# Patient Record
Sex: Female | Born: 1950
Health system: Southern US, Community
[De-identification: ages and names within clinical notes are randomized; demographics above are authoritative.]

## PROBLEM LIST (undated history)

## (undated) DIAGNOSIS — H269 Unspecified cataract: Secondary | ICD-10-CM

## (undated) DIAGNOSIS — D649 Anemia, unspecified: Secondary | ICD-10-CM

## (undated) DIAGNOSIS — E785 Hyperlipidemia, unspecified: Secondary | ICD-10-CM

## (undated) DIAGNOSIS — T7840XA Allergy, unspecified, initial encounter: Secondary | ICD-10-CM

## (undated) DIAGNOSIS — I10 Essential (primary) hypertension: Secondary | ICD-10-CM

## (undated) DIAGNOSIS — R7303 Prediabetes: Secondary | ICD-10-CM

## (undated) HISTORY — DX: Anemia, unspecified: D64.9

## (undated) HISTORY — DX: Allergy, unspecified, initial encounter: T78.40XA

## (undated) HISTORY — DX: Hyperlipidemia, unspecified: E78.5

## (undated) HISTORY — PX: COLONOSCOPY: SHX174

## (undated) HISTORY — DX: Unspecified cataract: H26.9

## (undated) HISTORY — PX: HYSTEROTOMY: SHX1776

## (undated) HISTORY — PX: COLONOSCOPY: SHX5424

---

## 1999-03-29 ENCOUNTER — Emergency Department (HOSPITAL_COMMUNITY): Admission: EM | Admit: 1999-03-29 | Discharge: 1999-03-29 | Payer: Self-pay | Admitting: Emergency Medicine

## 1999-03-29 ENCOUNTER — Encounter: Payer: Self-pay | Admitting: Emergency Medicine

## 2002-04-30 ENCOUNTER — Emergency Department (HOSPITAL_COMMUNITY): Admission: EM | Admit: 2002-04-30 | Discharge: 2002-04-30 | Payer: Self-pay | Admitting: Emergency Medicine

## 2002-11-05 ENCOUNTER — Emergency Department (HOSPITAL_COMMUNITY): Admission: EM | Admit: 2002-11-05 | Discharge: 2002-11-05 | Payer: Self-pay | Admitting: Emergency Medicine

## 2003-01-15 ENCOUNTER — Encounter: Payer: Self-pay | Admitting: Internal Medicine

## 2003-01-15 ENCOUNTER — Encounter: Admission: RE | Admit: 2003-01-15 | Discharge: 2003-01-15 | Payer: Self-pay | Admitting: Internal Medicine

## 2003-02-04 ENCOUNTER — Emergency Department (HOSPITAL_COMMUNITY): Admission: EM | Admit: 2003-02-04 | Discharge: 2003-02-04 | Payer: Self-pay | Admitting: Emergency Medicine

## 2003-04-04 ENCOUNTER — Encounter: Payer: Self-pay | Admitting: Obstetrics and Gynecology

## 2003-04-10 ENCOUNTER — Encounter (INDEPENDENT_AMBULATORY_CARE_PROVIDER_SITE_OTHER): Payer: Self-pay

## 2003-04-10 ENCOUNTER — Inpatient Hospital Stay (HOSPITAL_COMMUNITY): Admission: RE | Admit: 2003-04-10 | Discharge: 2003-04-12 | Payer: Self-pay | Admitting: Obstetrics and Gynecology

## 2003-05-26 ENCOUNTER — Emergency Department (HOSPITAL_COMMUNITY): Admission: EM | Admit: 2003-05-26 | Discharge: 2003-05-26 | Payer: Self-pay | Admitting: Emergency Medicine

## 2003-06-02 ENCOUNTER — Encounter: Payer: Self-pay | Admitting: Emergency Medicine

## 2003-06-02 ENCOUNTER — Emergency Department (HOSPITAL_COMMUNITY): Admission: AD | Admit: 2003-06-02 | Discharge: 2003-06-02 | Payer: Self-pay | Admitting: Emergency Medicine

## 2003-11-21 ENCOUNTER — Encounter: Admission: RE | Admit: 2003-11-21 | Discharge: 2004-01-01 | Payer: Self-pay | Admitting: Neurosurgery

## 2004-10-02 ENCOUNTER — Ambulatory Visit: Payer: Self-pay | Admitting: Internal Medicine

## 2005-06-29 ENCOUNTER — Emergency Department (HOSPITAL_COMMUNITY): Admission: EM | Admit: 2005-06-29 | Discharge: 2005-06-29 | Payer: Self-pay | Admitting: Emergency Medicine

## 2005-07-01 ENCOUNTER — Emergency Department (HOSPITAL_COMMUNITY): Admission: EM | Admit: 2005-07-01 | Discharge: 2005-07-01 | Payer: Self-pay | Admitting: Emergency Medicine

## 2005-08-16 ENCOUNTER — Emergency Department (HOSPITAL_COMMUNITY): Admission: EM | Admit: 2005-08-16 | Discharge: 2005-08-16 | Payer: Self-pay | Admitting: Emergency Medicine

## 2006-02-10 ENCOUNTER — Emergency Department (HOSPITAL_COMMUNITY): Admission: EM | Admit: 2006-02-10 | Discharge: 2006-02-10 | Payer: Self-pay | Admitting: Emergency Medicine

## 2006-02-13 ENCOUNTER — Emergency Department (HOSPITAL_COMMUNITY): Admission: EM | Admit: 2006-02-13 | Discharge: 2006-02-13 | Payer: Self-pay | Admitting: Emergency Medicine

## 2006-11-10 ENCOUNTER — Encounter: Admission: RE | Admit: 2006-11-10 | Discharge: 2007-02-08 | Payer: Self-pay | Admitting: Psychiatry

## 2008-01-27 ENCOUNTER — Emergency Department (HOSPITAL_COMMUNITY): Admission: EM | Admit: 2008-01-27 | Discharge: 2008-01-27 | Payer: Self-pay | Admitting: Emergency Medicine

## 2008-06-04 ENCOUNTER — Emergency Department (HOSPITAL_COMMUNITY): Admission: EM | Admit: 2008-06-04 | Discharge: 2008-06-04 | Payer: Self-pay | Admitting: Emergency Medicine

## 2009-08-29 ENCOUNTER — Emergency Department (HOSPITAL_COMMUNITY): Admission: EM | Admit: 2009-08-29 | Discharge: 2009-08-29 | Payer: Self-pay | Admitting: Emergency Medicine

## 2010-03-02 ENCOUNTER — Emergency Department (HOSPITAL_BASED_OUTPATIENT_CLINIC_OR_DEPARTMENT_OTHER): Admission: EM | Admit: 2010-03-02 | Discharge: 2010-03-02 | Payer: Self-pay | Admitting: Emergency Medicine

## 2011-02-19 NOTE — H&P (Signed)
NAME:  Crystal Foster, Crystal Foster                           ACCOUNT NO.:  [id number]   MEDICAL RECORD NO.:  [id number]                   PATIENT TYPE:  INP   LOCATION:  NA                                   FACILITY:  [facility]   PHYSICIAN:  Naima A. Dillard, M.D.              DATE OF BIRTH:  1951/04/27   DATE OF ADMISSION:  04/09/2003  DATE OF DISCHARGE:                                HISTORY & PHYSICAL   CHIEF COMPLAINT:  Abdominal pain, large uterine fibroids.   HISTORY OF PRESENT ILLNESS:  The patient is a 61 year old African American  female, gravida 1, para 1 with last menstrual period March 14, 2003, who  presented to me back in May 2004, complaining of abdominal pain for the last  four months that worsened with her period. The pain is in her abdomen and  radiates to the lower front. She has tried heating pads and Flexeril without  any help. The patient states that she did not realize how large the mass was  in her abdomen, which is 22 week size. She does not know if the fibroids  have been there or just recently came up. She denies having any change in  bowel or bladder habits. No major weight loss and no change in appetite. She  denies having any vaginal discharge, dyspareunia or urinary symptoms. She  denies having any history of kidney stones or GI symptoms or endometriosis.  The patient states that this was her first ultrasound which demonstrated  large fibroids.   PAST GYNECOLOGICAL HISTORY:  Significant for menarche at age 39 occurring  every month, lasting for five days. She uses Tampons q.2-3 h. She denies  having any intramenstrual finding or irregular bleeding. She does have a  history of Trichomonas which was treated in the past.   PAST SURGICAL HISTORY:  Significant for tubal ligation.   PAST MEDICAL HISTORY:  Significant for high blood pressure.   FAMILY HISTORY:  Significant for mother with breast cancer. Her last  mammogram was within normal limits.   SOCIAL HISTORY:  The  patient denies any alcohol, tobacco or illicit drug  use. The patient lives with her partner who is very supportive.   CURRENT MEDICATIONS:  The patient states that she is not on any medications  for her high blood pressure at this time.   REVIEW OF SYSTEMS:  HEENT:  The patient does wear reading glasses. CARDIAC:  She denies having any cardiovascular or respiratory illnesses.  GENITOURINARY:  As above. MUSCULOSKELETAL:  Normal. PSYCHIATRIC:  Normal.   PHYSICAL EXAMINATION:  VITAL SIGNS:  Blood pressure 140/100, weight 183  pounds.  HEENT:  Pupils are equal. Hearing is normal. Throat is clear.  NECK:  Her thyroid is not enlarged.  HEART:  Regular rate and rhythm.  LUNGS:  Clear to auscultation bilaterally.  BREASTS:  No masses, discharge, skin changes or nipple retraction.  BACK:  No  CVA tenderness.  ABDOMEN:  Is about 22 week size uterus, mobile.  EXTREMITIES:  No clubbing, cyanosis, or edema.  NEUROVASCULAR:  Examination is within normal limits.  PELVIC:  Her vulva and vaginal examinations are within normal limits. Her  cervix is nontender without lesions. Her uterus is normal shape, size, and  consistency. Her adnexa have no masses. Rectovaginally, there are no masses  palpated. Endometrial biopsy was attempted to be obtained and there were  just fragments of benign squamous mucosa. Pap smear in March 2004, was noted  to be normal.   LABORATORY DATA:  Her hemoglobin in the office was 10.0.   The patient's ultrasound and CT scan were significant for enlarged uterus  suggestive of fibroids, 22 week size. Her ovaries were not seen on  ultrasound. On CT scan, there was no intraabdominal adenopathy seen.   ASSESSMENT:  Symptomatic fibroid.  It is difficult to rule out sarcoma in  this patient because she stated that she never realized her fibroids were  this large. Dr. Stanford Breed will be on standby. The patient will undergo  a total abdominal hysterectomy, bilateral  salpingo-oophorectomy. All  treatments of fibroids were explained to the patient, medical treatment,  observation, uterine artery embolization, and hysterectomy. The patient  understands that she will not be able to have any children. She understands  that the risks are, but not limited to, bleeding, infection, damage to  internal organs such as bowel and bladder. She also understands that this  could be a cancer and she may need a more extensive surgery. Bowel  preparation was given to the patient.                                               Naima A. Normand Sloop, M.D.    NAD/MEDQ  D:  04/09/2003  T:  04/09/2003  Job:  [id number]   cc:   De Blanch, M.D.

## 2011-02-19 NOTE — Discharge Summary (Signed)
   NAME:  Crystal Foster, Crystal Foster                           ACCOUNT NO.:  [id number]   MEDICAL RECORD NO.:  [id number]                   PATIENT TYPE:  INP   LOCATION:  0465                                 FACILITY:  [facility]   PHYSICIAN:  Naima A. Dillard, M.D.              DATE OF BIRTH:  [birth year]   DATE OF ADMISSION:  04/10/2003  DATE OF DISCHARGE:  04/12/2003                                 DISCHARGE SUMMARY   ADMISSION DIAGNOSIS:  Symptomatic fibroids.   DISCHARGE DIAGNOSIS:  Postoperative day #2 status post total abdominal  hysterectomy/bilateral salpingo-oophorectomy.   DISCHARGE MEDICATIONS:  Percocet, Motrin, Phenergan, Colace, and Dilaudid.   INSTRUCTIONS:  The patient was to remain on pelvic rest, no driving for two  weeks, no heavy lifting for four weeks, and no intercourse for six weeks.  The patient was also given instructions per [facility] and was to report to the  office on April 20, 2003 for staple removal.   HOSPITAL COURSE:  The patient underwent at total abdominal hysterectomy and  bilateral salpingo-oophorectomy on April 10, 2003.  She remained afebrile with  stable vital signs.  The patient was noted to have anemia down to 9.6 and  hypokalemia with a potassium of 3.3.  The rest of her labs were within  normal limits.  Her exam remained benign.  She did have bowel recovery and  normal genitourinary function upon discharge.  The patient was discharged  home.  She will follow up in a couple of days for staple removal and for a  postoperative visit in six weeks.                                               Naima A. Normand Sloop, M.D.    NAD/MEDQ  D:  05/20/2003  T:  05/20/2003  Job:  [id number]

## 2011-02-19 NOTE — Op Note (Signed)
NAME:  Crystal Foster, Crystal Foster                           ACCOUNT NO.:  [id number]   MEDICAL RECORD NO.:  [id number]                   PATIENT TYPE:  INP   LOCATION:  0006                                 FACILITY:  [facility]   PHYSICIAN:  Naima A. Dillard, M.D.              DATE OF BIRTH:  03-31-1951   DATE OF PROCEDURE:  04/10/2003  DATE OF DISCHARGE:                                 OPERATIVE REPORT   PREOPERATIVE DIAGNOSIS:  Symptomatic fibroids, rule out sarcoma.   POSTOPERATIVE DIAGNOSIS:  Symptomatic fibroids.   PROCEDURE:  Total abdominal hysterectomy and bilateral salpingo-  oophorectomy.   SURGEON:  Naima A. Normand Sloop, M.D.   ASSISTANT:  Dois Davenport A. Rivard, M.D.   ANESTHESIA:  General endotracheal tube.   COMPLICATIONS:  None.   FINDINGS:  A 22-week size multi fibroid uterus, normal appearing tubes and  ovaries bilaterally, normal appearing abdominal anatomy, normal appendix.   ESTIMATED BLOOD LOSS:  300 mL.   URINE OUTPUT:  250 mL clear urine.   FLUIDS REPLACED:  3000 mL IV fluid.   DISPOSITION:  The patient went to the recovery room in stable condition.   INDICATIONS:  The patient is a 60 year old African American female, gravida  1, para 1, who is presenting for a TAH/BSO and rule out sarcoma.  The  patient believes that the fibroids may have grown over the last two months.  The patient understands the risk of the surgery not limited to bleeding,  infection, damage to internal organs such as bowel and bladder and  understands that she may need staging procedure by Dr. Stanford Breed.   DESCRIPTION OF PROCEDURE:  The patient was taken to the operating room,  prepped and draped in the normal sterile fashion.  A Foley catheter was  inserted.  A vertical incision was then made 2 cm above the symphysis pubis  to about 1 cm below the umbilicus.  It was carried down to the fascia using  Bovie cautery.  The fascia was incised in the midline and extended  inferiorly and superiorly  with Bovie cautery.  Kochers x2 were placed on the  fascia which was separated off the midline, both bluntly and sharply with  Bovie cautery.  The peritoneum was identified, tented up and entered sharply  and extended superiorly and inferiorly with good visualization of bowel and  bladder.  Peritoneal washings were obtained.  The uterus was felt to be  smooth.  The uterus was then removed from the abdominal cavity.  The bowel  was packed with laparotomy sponges.  The Balfour retractor was then placed  without difficulty.  The patient's round ligaments were suture ligated,  transected; hemostasis was assured.  The bladder flap was then created using  Metzenbaum scissors.  The vesicouterine peritoneum was cut and transected  along the cervix and bluntly and sharply dissected away from the cervix.   Attention was then turned to the patient's  left infundibulopelvic ligament.  The ureter was first identified.  The posterior aspect of the peritoneum was  transected and removed with blunt dissection with suction.  The ureter was  identified and noted to be peristaltic.  Verifiable from the ureter, the  infundibulopelvic ligament was doubly clamped and transected on the left,  first made hemostatic and tied off with a free tie and than stitched.  Hemostasis was noted.  The right infundibulopelvic ligament was doubly  clamped, transected and free tied and suture ligated in similar fashion.  The infundibulopelvic ligament was clamped.  Her right ureter was identified  and noted to be peristaltic.  The bladder was further dissected off the  cervix. The uterine arteries were skeletonized bilaterally and clamped with  Heaney clamps, transected, suture ligated.  Hemostasis was noted.  The  cardinal ligaments were doubly clamped, transected and suture ligated.  The  patient did have some backbleeding and when we got down to the cardinal  ligaments and decided to put a corkscrew into the top of the uterus  as a  means to help manipulate the uterus, it was so big.  The cardinal ligament  was doubly clamped, transected and suture ligated.  Hemostasis was assured.  The uterosacral ligament were doubly clamped, transected and suture ligated.  At this time, there was some bleeding from cervical branch of the uterine  artery and this was made hemostatic with a stitch and Bovie cautery on the  cervical stump.  After the cardinal ligaments were transected, the uterus  was removed and part of the cervix was sent down for frozen section.  Dr.  Guilford Shi called and said that each fibroid looked totally benign. The  endometrium also appeared benign and both ovaries and tubes were benign.  The cervix was then doubly clamped and transected off using the Jorgenson  scissors. Angles were suture ligated with 0 Vicryl and the cuff was suture  ligated in a figure-of-eight fashion with 0 Vicryls.  The abdomen and pelvis  was irrigated with copious amounts of saline.  The right cardinal ligament  was seen to have some oozing.  The right ureter was identified and then the  cardinal ligament was grasped and a free tie was placed around it.  Hemostasis was assured.  Saline irrigation with warm saline and hemostasis  was noted on all pedicles and along the cuff.  The patient's appendix was  noted to be normal at this time.  All instruments were removed from the  abdomen.  The fascia was closed with 0 Vicryl in a running fashion to the  midline from the top to the bottom and then to the midline from the bottom  to the top.  Hemostasis was assured.  The pelvic washings were discarded.  The fibroids were noted to be normal.  Dr. Guilford Shi noted it was greater than  1400 g.  The subcutaneous tissue was irrigated with saline.  Any bleeding  vessels were made hemostatic with Bovie cautery.  The skin was closed with  staples.  Sponge, lap, and needle counts were correct x2.  The patient went to the recovery room in stable  condition.   The omentum was smooth.  There was no studding and no signs of cancer.  Naima A. Normand Sloop, M.D.    NAD/MEDQ  D:  04/10/2003  T:  04/10/2003  Job:  [id number]

## 2012-01-07 ENCOUNTER — Encounter (HOSPITAL_COMMUNITY): Payer: Self-pay | Admitting: Emergency Medicine

## 2012-01-07 ENCOUNTER — Emergency Department (HOSPITAL_COMMUNITY)
Admission: EM | Admit: 2012-01-07 | Discharge: 2012-01-08 | Disposition: A | Discharge status: Home / Self Care | Payer: No Typology Code available for payment source | Attending: Emergency Medicine | Admitting: Emergency Medicine

## 2012-01-07 DIAGNOSIS — I10 Essential (primary) hypertension: Secondary | ICD-10-CM | POA: Insufficient documentation

## 2012-01-07 DIAGNOSIS — S139XXA Sprain of joints and ligaments of unspecified parts of neck, initial encounter: Secondary | ICD-10-CM | POA: Insufficient documentation

## 2012-01-07 DIAGNOSIS — M25569 Pain in unspecified knee: Secondary | ICD-10-CM | POA: Insufficient documentation

## 2012-01-07 DIAGNOSIS — S161XXA Strain of muscle, fascia and tendon at neck level, initial encounter: Secondary | ICD-10-CM

## 2012-01-07 DIAGNOSIS — R079 Chest pain, unspecified: Secondary | ICD-10-CM | POA: Insufficient documentation

## 2012-01-07 HISTORY — DX: Essential (primary) hypertension: I10
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
## 2012-01-08 ENCOUNTER — Emergency Department (HOSPITAL_COMMUNITY): Payer: No Typology Code available for payment source

## 2012-01-08 MED ORDER — DIAZEPAM 5 MG PO TABS: 5.0000 mg | ORAL_TABLET | Freq: Two times a day (BID) | ORAL | Status: AC

## 2012-01-08 MED ORDER — IBUPROFEN 600 MG PO TABS: 600.0000 mg | ORAL_TABLET | Freq: Four times a day (QID) | ORAL | Status: AC | PRN
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
## 2012-01-08 NOTE — ED Notes (Signed)
Patient transported to X-ray 

## 2012-01-12 NOTE — ED Provider Notes (Signed)
Medical screening examination/treatment/procedure(s) were performed by non-physician practitioner and as supervising physician I was immediately available for consultation/collaboration.  Nicolet Griffy K Linker, MD 01/12/12 0655 

## 2012-08-15 ENCOUNTER — Other Ambulatory Visit: Payer: Self-pay | Admitting: Surgery

## 2013-01-10 ENCOUNTER — Other Ambulatory Visit: Payer: Self-pay

## 2013-01-10 DIAGNOSIS — Z1231 Encounter for screening mammogram for malignant neoplasm of breast: Secondary | ICD-10-CM

## 2013-02-20 ENCOUNTER — Ambulatory Visit
Admission: RE | Admit: 2013-02-20 | Discharge: 2013-02-20 | Disposition: A | Discharge status: Home / Self Care | Payer: Medicaid Other | Source: Ambulatory Visit

## 2013-02-20 DIAGNOSIS — Z1231 Encounter for screening mammogram for malignant neoplasm of breast: Secondary | ICD-10-CM

## 2013-02-21 ENCOUNTER — Other Ambulatory Visit: Payer: Self-pay | Admitting: Family

## 2013-02-21 DIAGNOSIS — R928 Other abnormal and inconclusive findings on diagnostic imaging of breast: Secondary | ICD-10-CM

## 2013-03-06 ENCOUNTER — Ambulatory Visit
Admission: RE | Admit: 2013-03-06 | Discharge: 2013-03-06 | Disposition: A | Discharge status: Home / Self Care | Payer: Medicaid Other | Source: Ambulatory Visit | Attending: Family | Admitting: Family

## 2013-03-06 ENCOUNTER — Other Ambulatory Visit: Payer: Self-pay | Admitting: Family

## 2013-03-06 DIAGNOSIS — R928 Other abnormal and inconclusive findings on diagnostic imaging of breast: Secondary | ICD-10-CM

## 2013-03-15 ENCOUNTER — Inpatient Hospital Stay: Admission: RE | Admit: 2013-03-15 | Payer: Medicaid Other | Source: Ambulatory Visit

## 2013-03-21 ENCOUNTER — Ambulatory Visit
Admission: RE | Admit: 2013-03-21 | Discharge: 2013-03-21 | Disposition: A | Discharge status: Home / Self Care | Payer: Medicare Other | Source: Ambulatory Visit | Attending: Family | Admitting: Family

## 2013-03-21 ENCOUNTER — Other Ambulatory Visit: Payer: Self-pay | Admitting: Family

## 2013-03-21 ENCOUNTER — Other Ambulatory Visit (HOSPITAL_COMMUNITY): Payer: Self-pay | Admitting: Radiology

## 2013-03-21 DIAGNOSIS — R928 Other abnormal and inconclusive findings on diagnostic imaging of breast: Secondary | ICD-10-CM

## 2013-03-22 ENCOUNTER — Inpatient Hospital Stay: Admission: RE | Admit: 2013-03-22 | Payer: Medicare Other | Source: Ambulatory Visit

## 2013-04-19 ENCOUNTER — Emergency Department (HOSPITAL_COMMUNITY): Payer: Medicare Other

## 2013-04-19 ENCOUNTER — Emergency Department (HOSPITAL_COMMUNITY)
Admission: EM | Admit: 2013-04-19 | Discharge: 2013-04-19 | Disposition: A | Discharge status: Home / Self Care | Payer: Medicare Other | Attending: Emergency Medicine | Admitting: Emergency Medicine

## 2013-04-19 ENCOUNTER — Encounter (HOSPITAL_COMMUNITY): Payer: Self-pay | Admitting: Emergency Medicine

## 2013-04-19 DIAGNOSIS — Z862 Personal history of diseases of the blood and blood-forming organs and certain disorders involving the immune mechanism: Secondary | ICD-10-CM | POA: Insufficient documentation

## 2013-04-19 DIAGNOSIS — I10 Essential (primary) hypertension: Secondary | ICD-10-CM | POA: Insufficient documentation

## 2013-04-19 DIAGNOSIS — G8911 Acute pain due to trauma: Secondary | ICD-10-CM | POA: Insufficient documentation

## 2013-04-19 DIAGNOSIS — R109 Unspecified abdominal pain: Secondary | ICD-10-CM

## 2013-04-19 DIAGNOSIS — Z79899 Other long term (current) drug therapy: Secondary | ICD-10-CM | POA: Insufficient documentation

## 2013-04-19 DIAGNOSIS — Z8679 Personal history of other diseases of the circulatory system: Secondary | ICD-10-CM | POA: Insufficient documentation

## 2013-04-19 DIAGNOSIS — Z8639 Personal history of other endocrine, nutritional and metabolic disease: Secondary | ICD-10-CM | POA: Insufficient documentation

## 2013-04-19 DIAGNOSIS — Z8719 Personal history of other diseases of the digestive system: Secondary | ICD-10-CM | POA: Insufficient documentation

## 2013-04-19 HISTORY — DX: Prediabetes: R73.03

## 2013-04-19 LAB — URINALYSIS, ROUTINE W REFLEX MICROSCOPIC
Ketones, ur: NEGATIVE mg/dL
Leukocytes, UA: NEGATIVE
Protein, ur: NEGATIVE mg/dL
Urobilinogen, UA: 1 mg/dL (ref 0.0–1.0)

## 2013-04-19 MED ORDER — TRAMADOL HCL 50 MG PO TABS: 50.0000 mg | ORAL_TABLET | Freq: Four times a day (QID) | ORAL | Status: DC | PRN

## 2013-04-19 MED ORDER — IBUPROFEN 600 MG PO TABS: 600.0000 mg | ORAL_TABLET | Freq: Three times a day (TID) | ORAL | Status: AC

## 2013-04-19 MED ORDER — KETOROLAC TROMETHAMINE 30 MG/ML IJ SOLN
30.0000 mg | Freq: Once | INTRAMUSCULAR | Status: AC
  Administered 2013-04-19: 30 mg via INTRAMUSCULAR
  Filled 2013-04-19: qty 1

## 2013-04-19 NOTE — ED Notes (Signed)
Patient transported to X-ray 

## 2013-04-19 NOTE — ED Notes (Signed)
Patient returned from x ray. MD at bedside

## 2013-04-19 NOTE — ED Notes (Addendum)
Patient with history of falls without head injury or LOC beginning in February reports to ED for ongoing pain the past four months to her right side above the hip and below the rib cage, radiating to the back, for which she has been seen on multiple occasions without achieving adequate pain management. Per patient previous encounters have resulted in tentative diagnosis of fatty liver disease and providers have suspected that pain is muscular and related to falls. Patient reports constipation, with last bowel movement to be April 13, 2013. Patient denies numbness/tingling in feet or legs. Patient ambulates without assistance, ataxia, or difficulty. Patient attributes falls to weakness in her legs due to increases in the pain, denies dizziness, vision changes, or muscle spasms. Patient denies taking pain medications.
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
## 2014-05-29 ENCOUNTER — Other Ambulatory Visit: Payer: Self-pay

## 2014-05-29 DIAGNOSIS — Z1231 Encounter for screening mammogram for malignant neoplasm of breast: Secondary | ICD-10-CM

## 2014-06-07 ENCOUNTER — Ambulatory Visit
Admission: RE | Admit: 2014-06-07 | Discharge: 2014-06-07 | Disposition: A | Discharge status: Home / Self Care | Payer: PRIVATE HEALTH INSURANCE | Source: Ambulatory Visit

## 2014-06-07 ENCOUNTER — Encounter (INDEPENDENT_AMBULATORY_CARE_PROVIDER_SITE_OTHER): Payer: Self-pay

## 2014-06-07 DIAGNOSIS — Z1231 Encounter for screening mammogram for malignant neoplasm of breast: Secondary | ICD-10-CM

## 2014-10-04 HISTORY — PX: COLONOSCOPY: SHX174

## 2015-03-19 ENCOUNTER — Encounter (HOSPITAL_COMMUNITY): Payer: Self-pay | Admitting: Emergency Medicine

## 2015-03-19 ENCOUNTER — Emergency Department (HOSPITAL_COMMUNITY)
Admission: EM | Admit: 2015-03-19 | Discharge: 2015-03-20 | Disposition: A | Discharge status: Home / Self Care | Payer: Medicare Other | Attending: Emergency Medicine | Admitting: Emergency Medicine

## 2015-03-19 ENCOUNTER — Emergency Department (HOSPITAL_COMMUNITY): Payer: Medicare Other

## 2015-03-19 DIAGNOSIS — S0003XA Contusion of scalp, initial encounter: Secondary | ICD-10-CM | POA: Diagnosis not present

## 2015-03-19 DIAGNOSIS — G43909 Migraine, unspecified, not intractable, without status migrainosus: Secondary | ICD-10-CM | POA: Diagnosis not present

## 2015-03-19 DIAGNOSIS — Y998 Other external cause status: Secondary | ICD-10-CM | POA: Insufficient documentation

## 2015-03-19 DIAGNOSIS — I10 Essential (primary) hypertension: Secondary | ICD-10-CM | POA: Diagnosis not present

## 2015-03-19 DIAGNOSIS — Y9389 Activity, other specified: Secondary | ICD-10-CM | POA: Insufficient documentation

## 2015-03-19 DIAGNOSIS — S0083XA Contusion of other part of head, initial encounter: Secondary | ICD-10-CM | POA: Diagnosis not present

## 2015-03-19 DIAGNOSIS — S0990XA Unspecified injury of head, initial encounter: Secondary | ICD-10-CM | POA: Diagnosis present

## 2015-03-19 DIAGNOSIS — Y9289 Other specified places as the place of occurrence of the external cause: Secondary | ICD-10-CM | POA: Insufficient documentation

## 2015-03-19 DIAGNOSIS — Z7951 Long term (current) use of inhaled steroids: Secondary | ICD-10-CM | POA: Insufficient documentation

## 2015-03-19 DIAGNOSIS — Z79899 Other long term (current) drug therapy: Secondary | ICD-10-CM | POA: Diagnosis not present

## 2015-03-19 DIAGNOSIS — G44309 Post-traumatic headache, unspecified, not intractable: Secondary | ICD-10-CM

## 2015-03-19 MED ORDER — IBUPROFEN 800 MG PO TABS
800.0000 mg | ORAL_TABLET | Freq: Once | ORAL | Status: AC
  Administered 2015-03-19: 800 mg via ORAL
  Filled 2015-03-19: qty 1

## 2015-03-19 NOTE — ED Notes (Signed)
Patient transported to CT 
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
## 2015-03-20 DIAGNOSIS — S0003XA Contusion of scalp, initial encounter: Secondary | ICD-10-CM | POA: Diagnosis not present

## 2015-03-20 NOTE — ED Notes (Signed)
Pt. Given 240cc of water and saltines and graham crackers

## 2015-03-20 NOTE — ED Notes (Signed)
Pt. Left with all belongings and refused wheelchair 
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
## 2015-06-23 ENCOUNTER — Other Ambulatory Visit (HOSPITAL_COMMUNITY): Payer: Self-pay | Admitting: Family

## 2015-06-23 DIAGNOSIS — E215 Disorder of parathyroid gland, unspecified: Secondary | ICD-10-CM

## 2015-07-08 ENCOUNTER — Encounter (HOSPITAL_COMMUNITY): Payer: Medicare Other

## 2015-07-16 ENCOUNTER — Ambulatory Visit (HOSPITAL_COMMUNITY): Payer: Medicare Other

## 2015-07-16 ENCOUNTER — Encounter (HOSPITAL_COMMUNITY): Payer: Medicare Other

## 2015-07-22 ENCOUNTER — Other Ambulatory Visit: Payer: Self-pay

## 2015-07-22 DIAGNOSIS — Z1231 Encounter for screening mammogram for malignant neoplasm of breast: Secondary | ICD-10-CM

## 2015-07-31 ENCOUNTER — Encounter (HOSPITAL_COMMUNITY): Payer: Medicare Other

## 2015-08-21 ENCOUNTER — Ambulatory Visit
Admission: RE | Admit: 2015-08-21 | Discharge: 2015-08-21 | Disposition: A | Discharge status: Home / Self Care | Payer: Medicare Other | Source: Ambulatory Visit

## 2015-08-21 DIAGNOSIS — Z1231 Encounter for screening mammogram for malignant neoplasm of breast: Secondary | ICD-10-CM

## 2015-08-22 ENCOUNTER — Other Ambulatory Visit: Payer: Self-pay | Admitting: Family

## 2015-08-22 DIAGNOSIS — R928 Other abnormal and inconclusive findings on diagnostic imaging of breast: Secondary | ICD-10-CM

## 2016-08-12 DIAGNOSIS — R0789 Other chest pain: Secondary | ICD-10-CM | POA: Diagnosis not present

## 2016-08-12 DIAGNOSIS — M25512 Pain in left shoulder: Secondary | ICD-10-CM | POA: Diagnosis not present

## 2016-08-12 DIAGNOSIS — R0602 Shortness of breath: Secondary | ICD-10-CM | POA: Diagnosis not present

## 2016-08-12 DIAGNOSIS — Z79899 Other long term (current) drug therapy: Secondary | ICD-10-CM | POA: Diagnosis not present

## 2016-08-12 DIAGNOSIS — E782 Mixed hyperlipidemia: Secondary | ICD-10-CM | POA: Diagnosis not present

## 2017-01-05 DIAGNOSIS — I10 Essential (primary) hypertension: Secondary | ICD-10-CM | POA: Diagnosis not present

## 2017-01-05 DIAGNOSIS — E782 Mixed hyperlipidemia: Secondary | ICD-10-CM | POA: Diagnosis not present

## 2017-01-05 DIAGNOSIS — E559 Vitamin D deficiency, unspecified: Secondary | ICD-10-CM | POA: Diagnosis not present

## 2017-01-05 DIAGNOSIS — J302 Other seasonal allergic rhinitis: Secondary | ICD-10-CM | POA: Diagnosis not present

## 2017-01-05 DIAGNOSIS — L630 Alopecia (capitis) totalis: Secondary | ICD-10-CM | POA: Diagnosis not present

## 2017-05-16 DIAGNOSIS — I10 Essential (primary) hypertension: Secondary | ICD-10-CM | POA: Diagnosis not present

## 2017-05-16 DIAGNOSIS — Z79899 Other long term (current) drug therapy: Secondary | ICD-10-CM | POA: Diagnosis not present

## 2017-05-16 DIAGNOSIS — E782 Mixed hyperlipidemia: Secondary | ICD-10-CM | POA: Diagnosis not present

## 2017-05-23 DIAGNOSIS — Z79899 Other long term (current) drug therapy: Secondary | ICD-10-CM | POA: Diagnosis not present

## 2017-05-23 DIAGNOSIS — I10 Essential (primary) hypertension: Secondary | ICD-10-CM | POA: Diagnosis not present

## 2017-05-23 DIAGNOSIS — Z01419 Encounter for gynecological examination (general) (routine) without abnormal findings: Secondary | ICD-10-CM | POA: Diagnosis not present

## 2017-05-23 DIAGNOSIS — Z0000 Encounter for general adult medical examination without abnormal findings: Secondary | ICD-10-CM | POA: Diagnosis not present

## 2017-10-10 DIAGNOSIS — G9009 Other idiopathic peripheral autonomic neuropathy: Secondary | ICD-10-CM | POA: Diagnosis not present

## 2017-10-10 DIAGNOSIS — G43809 Other migraine, not intractable, without status migrainosus: Secondary | ICD-10-CM | POA: Diagnosis not present

## 2017-10-10 DIAGNOSIS — Z79899 Other long term (current) drug therapy: Secondary | ICD-10-CM | POA: Diagnosis not present

## 2017-10-11 DIAGNOSIS — Z79899 Other long term (current) drug therapy: Secondary | ICD-10-CM | POA: Diagnosis not present

## 2017-10-11 DIAGNOSIS — G9009 Other idiopathic peripheral autonomic neuropathy: Secondary | ICD-10-CM | POA: Diagnosis not present

## 2017-10-11 DIAGNOSIS — G43809 Other migraine, not intractable, without status migrainosus: Secondary | ICD-10-CM | POA: Diagnosis not present

## 2017-10-27 DIAGNOSIS — G43709 Chronic migraine without aura, not intractable, without status migrainosus: Secondary | ICD-10-CM | POA: Diagnosis not present

## 2017-10-27 DIAGNOSIS — G9009 Other idiopathic peripheral autonomic neuropathy: Secondary | ICD-10-CM | POA: Diagnosis not present

## 2017-10-27 DIAGNOSIS — G441 Vascular headache, not elsewhere classified: Secondary | ICD-10-CM | POA: Diagnosis not present

## 2017-10-27 DIAGNOSIS — Z79899 Other long term (current) drug therapy: Secondary | ICD-10-CM | POA: Diagnosis not present

## 2017-11-01 DIAGNOSIS — Z79899 Other long term (current) drug therapy: Secondary | ICD-10-CM | POA: Diagnosis not present

## 2017-11-01 DIAGNOSIS — R51 Headache: Secondary | ICD-10-CM | POA: Diagnosis not present

## 2018-01-26 DIAGNOSIS — G441 Vascular headache, not elsewhere classified: Secondary | ICD-10-CM | POA: Diagnosis not present

## 2018-01-26 DIAGNOSIS — E782 Mixed hyperlipidemia: Secondary | ICD-10-CM | POA: Diagnosis not present

## 2018-01-26 DIAGNOSIS — Z79899 Other long term (current) drug therapy: Secondary | ICD-10-CM | POA: Diagnosis not present

## 2018-01-26 DIAGNOSIS — I10 Essential (primary) hypertension: Secondary | ICD-10-CM | POA: Diagnosis not present

## 2018-03-02 DIAGNOSIS — R208 Other disturbances of skin sensation: Secondary | ICD-10-CM | POA: Diagnosis not present

## 2018-03-02 DIAGNOSIS — R601 Generalized edema: Secondary | ICD-10-CM | POA: Diagnosis not present

## 2018-04-27 ENCOUNTER — Other Ambulatory Visit: Payer: Self-pay

## 2018-04-27 NOTE — Patient Outreach (Signed)
[facility]) Care Management  04/27/2018  [name] May 01, 1951 [id number]   Medication Adherence call to Crystal Foster patient's telephone number is disconnected on [facility] and Epic,need a new telephone number for the patient ,Crystal Foster is showing past due on Atorvastatin 10 mg under [place].  [place] Management Direct Dial [phone]  Fax [phone] [email or web address]

## 2019-04-11 ENCOUNTER — Other Ambulatory Visit: Payer: Self-pay | Admitting: *Deleted

## 2019-04-11 DIAGNOSIS — Z20822 Contact with and (suspected) exposure to covid-19: Secondary | ICD-10-CM

## 2019-04-16 LAB — NOVEL CORONAVIRUS, NAA: SARS-CoV-2, NAA: NOT DETECTED

## 2019-10-05 HISTORY — PX: DENTAL SURGERY: SHX609

## 2020-03-27 DIAGNOSIS — R634 Abnormal weight loss: Secondary | ICD-10-CM | POA: Diagnosis not present

## 2020-03-27 DIAGNOSIS — R748 Abnormal levels of other serum enzymes: Secondary | ICD-10-CM | POA: Diagnosis not present

## 2020-03-27 DIAGNOSIS — R7401 Elevation of levels of liver transaminase levels: Secondary | ICD-10-CM | POA: Diagnosis not present

## 2020-03-27 DIAGNOSIS — R42 Dizziness and giddiness: Secondary | ICD-10-CM | POA: Diagnosis not present

## 2020-04-18 ENCOUNTER — Other Ambulatory Visit: Payer: Self-pay | Admitting: Family

## 2020-05-19 ENCOUNTER — Other Ambulatory Visit: Payer: Self-pay | Admitting: Family

## 2020-05-19 ENCOUNTER — Encounter: Payer: Self-pay | Admitting: Gastroenterology

## 2020-05-19 DIAGNOSIS — Z1231 Encounter for screening mammogram for malignant neoplasm of breast: Secondary | ICD-10-CM

## 2020-05-26 ENCOUNTER — Ambulatory Visit (INDEPENDENT_AMBULATORY_CARE_PROVIDER_SITE_OTHER): Payer: Medicare Other | Admitting: Gastroenterology

## 2020-05-26 ENCOUNTER — Encounter: Payer: Self-pay | Admitting: Gastroenterology

## 2020-05-26 ENCOUNTER — Other Ambulatory Visit (INDEPENDENT_AMBULATORY_CARE_PROVIDER_SITE_OTHER): Payer: Medicare Other

## 2020-05-26 VITALS — BP 102/62 | HR 72 | Ht 62.0 in | Wt 165.0 lb

## 2020-05-26 DIAGNOSIS — R748 Abnormal levels of other serum enzymes: Secondary | ICD-10-CM

## 2020-05-26 DIAGNOSIS — R634 Abnormal weight loss: Secondary | ICD-10-CM

## 2020-05-26 DIAGNOSIS — K5900 Constipation, unspecified: Secondary | ICD-10-CM | POA: Diagnosis not present

## 2020-05-26 DIAGNOSIS — D509 Iron deficiency anemia, unspecified: Secondary | ICD-10-CM | POA: Diagnosis not present

## 2020-05-26 LAB — COMPREHENSIVE METABOLIC PANEL
ALT: 14 U/L (ref 0–35)
AST: 30 U/L (ref 0–37)
Albumin: 3.9 g/dL (ref 3.5–5.2)
Alkaline Phosphatase: 107 U/L (ref 39–117)
BUN: 20 mg/dL (ref 6–23)
CO2: 26 mEq/L (ref 19–32)
Calcium: 13.7 mg/dL (ref 8.4–10.5)
Chloride: 104 mEq/L (ref 96–112)
Creatinine, Ser: 1.54 mg/dL — ABNORMAL HIGH (ref 0.40–1.20)
GFR: 40.7 mL/min — ABNORMAL LOW (ref >60.00)
Glucose, Bld: 115 mg/dL — ABNORMAL HIGH (ref 70–99)
Potassium: 3.7 mEq/L (ref 3.5–5.1)
Sodium: 137 mEq/L (ref 135–145)
Total Bilirubin: 0.7 mg/dL (ref 0.2–1.2)
Total Protein: 7.7 g/dL (ref 6.0–8.3)

## 2020-05-26 LAB — VITAMIN B12: Vitamin B-12: 344 pg/mL (ref 211–911)

## 2020-05-26 LAB — CBC WITH DIFFERENTIAL/PLATELET
Basophils Absolute: 0.1 10*3/uL (ref 0.0–0.1)
Basophils Relative: 0.6 % (ref 0.0–3.0)
Eosinophils Absolute: 0.2 10*3/uL (ref 0.0–0.7)
Eosinophils Relative: 1.4 % (ref 0.0–5.0)
HCT: 32.5 % — ABNORMAL LOW (ref 36.0–46.0)
Hemoglobin: 10.5 g/dL — ABNORMAL LOW (ref 12.0–15.0)
Lymphocytes Relative: 21.9 % (ref 12.0–46.0)
Lymphs Abs: 2.5 10*3/uL (ref 0.7–4.0)
MCHC: 32.3 g/dL (ref 30.0–36.0)
MCV: 88.3 fl (ref 78.0–100.0)
Monocytes Absolute: 0.5 10*3/uL (ref 0.1–1.0)
Monocytes Relative: 4.1 % (ref 3.0–12.0)
Neutro Abs: 8.3 10*3/uL — ABNORMAL HIGH (ref 1.4–7.7)
Neutrophils Relative %: 72 % (ref 43.0–77.0)
Platelets: 246 10*3/uL (ref 150.0–400.0)
RBC: 3.68 Mil/uL — ABNORMAL LOW (ref 3.87–5.11)
RDW: 14 % (ref 11.5–15.5)
WBC: 11.6 10*3/uL — ABNORMAL HIGH (ref 4.0–10.5)

## 2020-05-26 LAB — FOLATE: Folate: 8.7 ng/mL (ref >5.9)

## 2020-05-26 LAB — IBC + FERRITIN
Ferritin: 278.8 ng/mL (ref 10.0–291.0)
Iron: 21 ug/dL — ABNORMAL LOW (ref 42–145)
Saturation Ratios: 5.9 % — ABNORMAL LOW (ref 20.0–50.0)
Transferrin: 254 mg/dL (ref 212.0–360.0)

## 2020-05-26 NOTE — Progress Notes (Signed)
History of Present Illness: This is a 69 year old female referred by Sheran Fava, FNP for the evaluation of weight loss, minimally elevated alk phos, hypercalcemia, constipation, anemia. She is accompanied by her daughter.  She notes a gradual 30 pound weight loss over the past 6 months with her same level of physical activity and same diet.  Her appetite is good.  She has had constipation for many years and relates a bowel movement about once every 2 weeks and this has not changed.  She was found to be anemic by her PCP and placed on iron for the past few months.  Most recent hemoglobin 9.5 with normal indices.  She has had a persistently elevated calcium most recently at 12.5.  Her alkaline phosphatase has been minimally elevated at 125 and more recently 138 with the upper limits of normal at 121.  GGT was normal at 27.  TSH normal.  Stool for WBCs and O&P were both negative.  She relates having prior colonoscopies, probably by Dr. Collene Mares, with the most recent one about 5 to 7 years ago.  She feels the last colonoscopy was normal. Denies abdominal pain, diarrhea, change in stool caliber, melena, hematochezia, nausea, vomiting, dysphagia, reflux symptoms, chest pain.    Allergies  Allergen Reactions  . Hydrocodone Itching   Outpatient Medications Prior to Visit  Medication Sig Dispense Refill  . aspirin EC 81 MG tablet Take 81 mg by mouth daily. Swallow whole.    Marland Kitchen atorvastatin (LIPITOR) 10 MG tablet Take 10 mg by mouth daily.    [place] Kitchen docusate sodium (COLACE) 100 MG capsule Take 100 mg by mouth 2 (two) times daily.    . ergocalciferol (VITAMIN D2) 50000 UNITS capsule Take 50,000 Units by mouth 2 (two) times a week. Monday and Friday    . ferrous sulfate 325 (65 FE) MG tablet Take 325 mg by mouth daily with breakfast.    . levocetirizine (XYZAL) 5 MG tablet Take 5 mg by mouth at bedtime.    Marland Kitchen lisinopril-hydrochlorothiazide (PRINZIDE,ZESTORETIC) 20-12.5 MG per tablet Take 1 tablet by mouth  daily.    . fluticasone (FLONASE) 50 MCG/ACT nasal spray Place 2 sprays into the nose daily.    . traMADol (ULTRAM) 50 MG tablet Take 1 tablet (50 mg total) by mouth every 6 (six) hours as needed (for breakthrough pain). 15 tablet 0   No facility-administered medications prior to visit.   Past Medical History:  Diagnosis Date  . Cataract   . Hyperlipidemia   . Hypertension   . Migraine   . Prediabetes    Past Surgical History:  Procedure Laterality Date  . COLONOSCOPY    . HYSTEROTOMY     Social History   Socioeconomic History  . Marital status: Single    Spouse name: Not on file  . Number of children: Not on file  . Years of education: Not on file  . Highest education level: Not on file  Occupational History  . Not on file  Tobacco Use  . Smoking status: Never Smoker  . Smokeless tobacco: Never Used  Vaping Use  . Vaping Use: Never used  Substance and Sexual Activity  . Alcohol use: No  . Drug use: No  . Sexual activity: Not on file  Other Topics Concern  . Not on file  Social History Narrative  . Not on file   Social Determinants of Health   Financial Resource Strain:   . Difficulty of Paying Living Expenses: Not on  file  Food Insecurity:   . Worried About Charity fundraiser in the Last Year: Not on file  . Ran Out of Food in the Last Year: Not on file  Transportation Needs:   . Lack of Transportation (Medical): Not on file  . Lack of Transportation (Non-Medical): Not on file  Physical Activity:   . Days of Exercise per Week: Not on file  . Minutes of Exercise per Session: Not on file  Stress:   . Feeling of Stress : Not on file  Social Connections:   . Frequency of Communication with Friends and Family: Not on file  . Frequency of Social Gatherings with Friends and Family: Not on file  . Attends Religious Services: Not on file  . Active Member of Clubs or Organizations: Not on file  . Attends Archivist Meetings: Not on file  . Marital  Status: Not on file   Family History  Problem Relation Age of Onset  . Stomach cancer Maternal Aunt   . Breast cancer Mother   . Heart disease Mother   . Lung cancer Mother   . Diabetes Mother   . Glaucoma Mother   . Cataracts Mother   . Asthma Mother   . Dementia Sister   . Diabetes Sister   . Fibromyalgia Sister   . Cataracts Sister   . Colon cancer Neg Hx   . Rectal cancer Neg Hx   . Pancreatic cancer Neg Hx      Review of Systems: Pertinent positive and negative review of systems were noted in the above HPI section. All other review of systems were otherwise negative.   Physical Exam: General: Well developed, well nourished, no acute distress Head: Normocephalic and atraumatic Eyes:  sclerae anicteric, EOMI Ears: Normal auditory acuity Mouth: Not examined, mask on during Covid-19 pandemic Neck: Supple, no masses or thyromegaly Lungs: Clear throughout to auscultation Heart: Regular rate and rhythm; no murmurs, rubs or bruits Abdomen: Soft, non tender and non distended. No masses, hepatosplenomegaly or hernias noted. Normal Bowel sounds Rectal: Not done  Musculoskeletal: Symmetrical with no gross deformities  Skin: No lesions on visible extremities Pulses:  Normal pulses noted Extremities: No clubbing, cyanosis, edema or deformities noted Neurological: Alert oriented x 4, grossly nonfocal Cervical Nodes:  No significant cervical adenopathy Inguinal Nodes: No significant inguinal adenopathy Psychological:  Alert and cooperative. Normal mood and affect   Assessment and Recommendations:  1. Weight loss, constipation, hypercalcemia, elevated Cr, normocytic anemia, minimally elevated alk phos with a normal GGT. Check Fe, TIBC, ferritin, B12, folate, PTH, 5'NT, CBC, CMP today.  Rule out hyperparathyroidism, neoplasm, diuretic side effect.  May need endocrinology evaluation.  Miralax qd to bid.  Continue iron for now. Await result of CT AP scheduled tomorrow at 4 PM.   Request colonoscopy records from Dr. Collene Mares.  May need colonoscopy and EGD for further evaluation pending CT report, blood work results and review of most recent colonoscopy. REV in 6 weeks.    cc: Sheran Fava, FNP

## 2020-05-26 NOTE — Patient Instructions (Signed)
Your provider has requested that you go to the basement level for lab work before leaving today. Press "B" on the elevator. The lab is located at the first door on the left as you exit the elevator.  We will review your CT scan when the results are back.  Take over the counter Miralax 1-2 x daily.   Due to recent changes in healthcare laws, you may see the results of your imaging and laboratory studies on MyChart before your provider has had a chance to review them.  We understand that in some cases there may be results that are confusing or concerning to you. Not all laboratory results come back in the same time frame and the provider may be waiting for multiple results in order to interpret others.  Please give [place] 48 hours in order for your provider to thoroughly review all the results before contacting the office for clarification of your results.   Thank you for choosing me and [facility].  Pricilla Riffle. Dagoberto Ligas., MD., [place]

## 2020-05-27 ENCOUNTER — Other Ambulatory Visit: Payer: Self-pay

## 2020-05-27 ENCOUNTER — Ambulatory Visit
Admission: RE | Admit: 2020-05-27 | Discharge: 2020-05-27 | Disposition: A | Discharge status: Home / Self Care | Payer: Medicare Other | Source: Ambulatory Visit | Attending: Family | Admitting: Family

## 2020-05-27 ENCOUNTER — Other Ambulatory Visit: Payer: Self-pay | Admitting: Family

## 2020-05-27 DIAGNOSIS — R634 Abnormal weight loss: Secondary | ICD-10-CM

## 2020-05-27 DIAGNOSIS — Z1231 Encounter for screening mammogram for malignant neoplasm of breast: Secondary | ICD-10-CM

## 2020-05-28 ENCOUNTER — Other Ambulatory Visit: Payer: Self-pay

## 2020-05-28 ENCOUNTER — Telehealth: Payer: Self-pay

## 2020-05-28 DIAGNOSIS — E349 Endocrine disorder, unspecified: Secondary | ICD-10-CM

## 2020-05-28 NOTE — Telephone Encounter (Signed)
-----   Message from Ladene Artist, MD sent at 05/28/2020 11:59 AM EDT ----- Please be sure she has contacted her PCP about the findings requiring Urology evaluation: 2 stones in the right ureteropelvic junction with moderate right hydronephrosis.  Additional layering nonobstructing right renal inferior pole stone noted. Left renal cyst.  No GI findings.   ----- Message ----- From: Lindon Romp, CMA Sent: 05/28/2020  11:57 AM EDT To: Ladene Artist, MD  CT scan results are under imaging. She has a f/u scheduled with you in 6 weeks. Just FYI. ----- Message ----- From: Lindon Romp, CMA Sent: 05/28/2020 To: Lindon Romp, CMA  Review CT scan results

## 2020-05-28 NOTE — Telephone Encounter (Signed)
Called patient with no answer and no voicemail to leave a message. 

## 2020-05-30 NOTE — Telephone Encounter (Signed)
Called patient with no answer and no voicemail to leave a message. 

## 2020-06-03 LAB — NUCLEOTIDASE, 5', BLOOD: 5-Nucleotidase: 4 U/L (ref 0–10)

## 2020-06-03 LAB — PARATHYROID HORMONE, INTACT (NO CA): PTH: 207 pg/mL — ABNORMAL HIGH (ref 14–64)

## 2020-07-08 ENCOUNTER — Ambulatory Visit (AMBULATORY_SURGERY_CENTER): Payer: Self-pay

## 2020-07-08 ENCOUNTER — Other Ambulatory Visit: Payer: Self-pay

## 2020-07-08 VITALS — Ht 62.0 in | Wt 166.0 lb

## 2020-07-08 DIAGNOSIS — K5900 Constipation, unspecified: Secondary | ICD-10-CM

## 2020-07-08 DIAGNOSIS — R634 Abnormal weight loss: Secondary | ICD-10-CM

## 2020-07-08 MED ORDER — PEG 3350-KCL-NA BICARB-NACL 420 G PO SOLR: 4000.0000 mL | Freq: Once | ORAL | 0 refills | Status: AC

## 2020-07-08 NOTE — Progress Notes (Signed)
No egg or soy allergy known to patient  No issues with past sedation with any surgeries or procedures no intubation problems in the past  No FH of Malignant Hyperthermia No diet pills per patient No home 02 use per patient  No blood thinners per patient   Pt has  issues with constipation , 2 day prep ordered  No A fib or A flutter  EMMI video to pt or via [place] 19 guidelines implemented in PV today with Pt and RN    Due to the COVID-19 pandemic we are asking patients to follow these guidelines. Please only bring one care partner. Please be aware that your care partner may wait in the car in the parking lot or if they feel like they will be too hot to wait in the car, they may wait in the lobby on the 4th floor. All care partners are required to wear a mask the entire time (we do not have any that we can provide them), they need to practice social distancing, and we will do a Covid check for all patient's and care partners when you arrive. Also we will check their temperature and your temperature. If the care partner waits in their car they need to stay in the parking lot the entire time and we will call them on their cell phone when the patient is ready for discharge so they can bring the car to the front of the building. Also all patient's will need to wear a mask into building.

## 2020-07-15 ENCOUNTER — Encounter: Payer: Self-pay | Admitting: Gastroenterology

## 2020-07-15 ENCOUNTER — Other Ambulatory Visit: Payer: Self-pay

## 2020-07-15 ENCOUNTER — Ambulatory Visit (AMBULATORY_SURGERY_CENTER): Payer: Medicare Other | Admitting: Gastroenterology

## 2020-07-15 VITALS — BP 103/58 | HR 63 | Temp 96.4°F | Resp 14 | Ht 62.0 in | Wt 166.0 lb

## 2020-07-15 DIAGNOSIS — D509 Iron deficiency anemia, unspecified: Secondary | ICD-10-CM

## 2020-07-15 DIAGNOSIS — R194 Change in bowel habit: Secondary | ICD-10-CM | POA: Diagnosis not present

## 2020-07-15 DIAGNOSIS — K449 Diaphragmatic hernia without obstruction or gangrene: Secondary | ICD-10-CM

## 2020-07-15 DIAGNOSIS — R634 Abnormal weight loss: Secondary | ICD-10-CM

## 2020-07-15 DIAGNOSIS — K640 First degree hemorrhoids: Secondary | ICD-10-CM

## 2020-07-15 DIAGNOSIS — K5520 Angiodysplasia of colon without hemorrhage: Secondary | ICD-10-CM

## 2020-07-15 DIAGNOSIS — D123 Benign neoplasm of transverse colon: Secondary | ICD-10-CM

## 2020-07-15 HISTORY — PX: UPPER GASTROINTESTINAL ENDOSCOPY: SHX188

## 2020-07-15 HISTORY — PX: COLONOSCOPY: SHX174

## 2020-07-15 MED ORDER — SODIUM CHLORIDE 0.9 % IV SOLN: 500.0000 mL | Freq: Once | INTRAVENOUS | Status: DC

## 2020-07-15 NOTE — Op Note (Signed)
[facility] Patient Name: Crystal Foster Procedure Date: 07/15/2020 2:07 PM MRN: [id number] Endoscopist: Ladene Artist , MD Age: 69 Referring MD:  Date of Birth: [birth year] Gender: Female Account #: [id number] Procedure:                Upper GI endoscopy Indications:              Unexplained iron deficiency anemia, Weight loss Medicines:                Monitored Anesthesia Care Procedure:                Pre-Anesthesia Assessment:                           - Prior to the procedure, a History and Physical                            was performed, and patient medications and                            allergies were reviewed. The patient's tolerance of                            previous anesthesia was also reviewed. The risks                            and benefits of the procedure and the sedation                            options and risks were discussed with the patient.                            All questions were answered, and informed consent                            was obtained. Prior Anticoagulants: The patient has                            taken no previous anticoagulant or antiplatelet                            agents. ASA Grade Assessment: II - A patient with                            mild systemic disease. After reviewing the risks                            and benefits, the patient was deemed in                            satisfactory condition to undergo the procedure.                           After obtaining informed consent, the endoscope was  passed under direct vision. Throughout the                            procedure, the patient's blood pressure, pulse, and                            oxygen saturations were monitored continuously. The                            Endoscope was introduced through the mouth, and                            advanced to the second part of duodenum. The upper                            GI  endoscopy was accomplished without difficulty.                            The patient tolerated the procedure well. Scope In: Scope Out: Findings:                 The examined esophagus was normal.                           A small hiatal hernia was present.                           The exam of the stomach was otherwise normal.                           The duodenal bulb and second portion of the                            duodenum were normal. Biopsies for histology were                            taken with a cold forceps for evaluation of celiac                            disease. Complications:            No immediate complications. Estimated Blood Loss:     Estimated blood loss was minimal. Impression:               - Normal esophagus.                           - Small hiatal hernia.                           - Normal duodenal bulb and second portion of the                            duodenum. Biopsied. Recommendation:           - Patient has a contact number available for  emergencies. The signs and symptoms of potential                            delayed complications were discussed with the                            patient. Return to normal activities tomorrow.                            Written discharge instructions were provided to the                            patient.                           - Resume previous diet.                           - Continue present medications.                           - Await pathology results. Ladene Artist, MD 07/15/2020 2:50:46 PM This report has been signed electronically.

## 2020-07-15 NOTE — Progress Notes (Signed)
Called to room to assist during endoscopic procedure.  Patient ID and intended procedure confirmed with present staff. Received instructions for my participation in the procedure from the performing physician.  

## 2020-07-15 NOTE — Patient Instructions (Signed)
YOU HAD AN ENDOSCOPIC PROCEDURE TODAY AT THE [facility]:   Refer to the procedure report that was given to you for any specific questions about what was found during the examination.  If the procedure report does not answer your questions, please call your gastroenterologist to clarify.  If you requested that your care partner not be given the details of your procedure findings, then the procedure report has been included in a sealed envelope for you to review at your convenience later.  YOU SHOULD EXPECT: Some feelings of bloating in the abdomen. Passage of more gas than usual.  Walking can help get rid of the air that was put into your GI tract during the procedure and reduce the bloating. If you had a lower endoscopy (such as a colonoscopy or flexible sigmoidoscopy) you may notice spotting of blood in your stool or on the toilet paper. If you underwent a bowel prep for your procedure, you may not have a normal bowel movement for a few days.  Please Note:  You might notice some irritation and congestion in your nose or some drainage.  This is from the oxygen used during your procedure.  There is no need for concern and it should clear up in a day or so.  SYMPTOMS TO REPORT IMMEDIATELY:   Following lower endoscopy (colonoscopy or flexible sigmoidoscopy):  Excessive amounts of blood in the stool  Significant tenderness or worsening of abdominal pains  Swelling of the abdomen that is new, acute  Fever of 100F or higher   Following upper endoscopy (EGD)  Vomiting of blood or coffee ground material  New chest pain or pain under the shoulder blades  Painful or persistently difficult swallowing  New shortness of breath  Fever of 100F or higher  Black, tarry-looking stools  For urgent or emergent issues, a gastroenterologist can be reached at any hour by calling [phone]. Do not use MyChart messaging for urgent concerns.    DIET:  We do recommend a small meal at first, but  then you may proceed to your regular diet.  Drink plenty of fluids but you should avoid alcoholic beverages for 24 hours.  MEDICATIONS: Continue present medications.  Please see handouts given to you by your recovery nurse.  ACTIVITY:  You should plan to take it easy for the rest of today and you should NOT DRIVE or use heavy machinery until tomorrow (because of the sedation medicines used during the test).    FOLLOW UP: Our staff will call the number listed on your records 48-72 hours following your procedure to check on you and address any questions or concerns that you may have regarding the information given to you following your procedure. If we do not reach you, we will leave a message.  We will attempt to reach you two times.  During this call, we will ask if you have developed any symptoms of COVID 19. If you develop any symptoms (ie: fever, flu-like symptoms, shortness of breath, cough etc.) before then, please call [phone].  If you test positive for Covid 19 in the 2 weeks post procedure, please call and report this information to us.    If any biopsies were taken you will be contacted by phone or by letter within the next 1-3 weeks.  Please call us at [phone] if you have not heard about the biopsies in 3 weeks.   Thank you for allowing us to provide for your healthcare needs today.   SIGNATURES/CONFIDENTIALITY: You   and/or your care partner have signed paperwork which will be entered into your electronic medical record.  These signatures attest to the fact that that the information above on your After Visit Summary has been reviewed and is understood.  Full responsibility of the confidentiality of this discharge information lies with you and/or your care-partner.

## 2020-07-15 NOTE — Op Note (Signed)
[facility] Patient Name: Crystal Foster Procedure Date: 07/15/2020 2:09 PM MRN: [id number] Endoscopist: Ladene Artist , MD Age: 69 Referring MD:  Date of Birth: 1951-06-07 Gender: Female Account #: [id number] Procedure:                Colonoscopy Indications:              Unexplained iron deficiency anemia, Change in bowel                            habits, Weight loss Medicines:                Monitored Anesthesia Care Procedure:                Pre-Anesthesia Assessment:                           - Prior to the procedure, a History and Physical                            was performed, and patient medications and                            allergies were reviewed. The patient's tolerance of                            previous anesthesia was also reviewed. The risks                            and benefits of the procedure and the sedation                            options and risks were discussed with the patient.                            All questions were answered, and informed consent                            was obtained. Prior Anticoagulants: The patient has                            taken no previous anticoagulant or antiplatelet                            agents. ASA Grade Assessment: II - A patient with                            mild systemic disease. After reviewing the risks                            and benefits, the patient was deemed in                            satisfactory condition to undergo the procedure.  After obtaining informed consent, the colonoscope                            was passed under direct vision. Throughout the                            procedure, the patient's blood pressure, pulse, and                            oxygen saturations were monitored continuously. The                            Colonoscope was introduced through the anus and                            advanced to the the cecum, identified  by                            appendiceal orifice and ileocecal valve. The                            ileocecal valve, appendiceal orifice, and rectum                            were photographed. The quality of the bowel                            preparation was good. The colonoscopy was performed                            without difficulty. The patient tolerated the                            procedure well. Scope In: 2:15:44 PM Scope Out: 2:34:09 PM Scope Withdrawal Time: 0 hours 16 minutes 3 seconds  Total Procedure Duration: 0 hours 18 minutes 25 seconds  Findings:                 The perianal and digital rectal examinations were                            normal.                           A 6 mm polyp was found in the hepatic flexure. The                            polyp was sessile. The polyp was removed with a                            cold snare. Resection and retrieval were complete.                           A single medium-sized localized angiodysplastic  lesion without bleeding was found at the ileocecal                            valve.                           Internal hemorrhoids were found during                            retroflexion. The hemorrhoids were small and Grade                            I (internal hemorrhoids that do not prolapse).                           The exam was otherwise without abnormality on                            direct and retroflexion views. Complications:            No immediate complications. Estimated blood loss:                            None. Estimated Blood Loss:     Estimated blood loss: none. Impression:               - One 6 mm polyp at the hepatic flexure, removed                            with a cold snare. Resected and retrieved.                           - A single non-bleeding colonic angiodysplastic                            lesion at IC valve.                           - Internal  hemorrhoids.                           - The examination was otherwise normal on direct                            and retroflexion views. Recommendation:           - Repeat colonoscopy after studies are complete for                            surveillance based on pathology results.                           - Patient has a contact number available for                            emergencies. The signs and symptoms of potential  delayed complications were discussed with the                            patient. Return to normal activities tomorrow.                            Written discharge instructions were provided to the                            patient.                           - Resume previous diet.                           - Continue present medications.                           - Await pathology results. Ladene Artist, MD 07/15/2020 2:47:53 PM This report has been signed electronically.

## 2020-07-15 NOTE — Progress Notes (Signed)
Report to PACU, RN, vss, BBS= Clear.  

## 2020-07-15 NOTE — Progress Notes (Signed)
Pt's states no medical or surgical changes since previsit or office visit.  Vitals per [facility]

## 2020-07-17 ENCOUNTER — Telehealth: Payer: Self-pay

## 2020-07-17 ENCOUNTER — Telehealth: Payer: Self-pay | Admitting: *Deleted

## 2020-07-17 NOTE — Telephone Encounter (Signed)
°  Follow up Call-  Call back number 07/15/2020  Permission to leave phone message Yes  Some recent data might be hidden    Spoke with pts daughter.  She states she is back to normal.  Patient questions:  Do you have a fever, pain , or abdominal swelling? No. Pain Score  0 *  Have you tolerated food without any problems? Yes.    Have you been able to return to your normal activities? Yes.    Do you have any questions about your discharge instructions: Diet   No. Medications  No. Follow up visit  No.  Do you have questions or concerns about your Care? No.  Actions: * If pain score is 4 or above: No action needed, pain <4.

## 2020-07-17 NOTE — Telephone Encounter (Signed)
Called [phone] and left a message we tried to reach pt for a follow up call. maw

## 2020-07-29 ENCOUNTER — Encounter: Payer: Self-pay | Admitting: Gastroenterology

## 2020-08-04 ENCOUNTER — Ambulatory Visit (INDEPENDENT_AMBULATORY_CARE_PROVIDER_SITE_OTHER): Payer: Medicare Other | Admitting: Endocrinology

## 2020-08-04 ENCOUNTER — Encounter: Payer: Self-pay | Admitting: Endocrinology

## 2020-08-04 ENCOUNTER — Other Ambulatory Visit: Payer: Self-pay

## 2020-08-04 DIAGNOSIS — E213 Hyperparathyroidism, unspecified: Secondary | ICD-10-CM | POA: Diagnosis not present

## 2020-08-04 LAB — PTH, INTACT AND CALCIUM
Calcium: 13 mg/dL (ref 8.6–10.4)
PTH: 184 pg/mL — ABNORMAL HIGH (ref 14–64)

## 2020-08-04 LAB — TSH: TSH: 1.25 u[IU]/mL (ref 0.35–4.50)

## 2020-08-04 LAB — VITAMIN D 25 HYDROXY (VIT D DEFICIENCY, FRACTURES): VITD: 54.89 ng/mL (ref 30.00–100.00)

## 2020-08-04 MED ORDER — LISINOPRIL 20 MG PO TABS: 20.0000 mg | ORAL_TABLET | Freq: Every day | ORAL | 3 refills | Status: AC

## 2020-08-04 NOTE — Patient Instructions (Signed)
Blood tests are requested for you today.  We'll let you know about the results.  Please see a surgery specialist.  you will receive a phone call, about a day and time for an appointment. I have sent a prescription to your pharmacy, to change the lisinopril/HCTZ to just the lisinopril. Please come back for a follow-up appointment in 2-3 weeks.

## 2020-08-04 NOTE — Progress Notes (Signed)
Subjective:    Patient ID: Crystal Foster, female    DOB: [birth year], 69 y.o.   MRN: [id number]  HPI Pt is referred by Dr Fuller Plan, for hypercalcemia.  Pt was noted to have hypercalcemia in 2021.  she has never had osteoporosis, urolithiasis, thyroid probs, sarcoidosis, cancer, PUD, pancreatitis, depression, or bony fracture was right 3rd toe (many years, with injury).  she does not take vitamin-A supplements.  Pt denies taking antacids or Li++.  She takes vit-D, 5000 units/week.  Main symptom is generalized weakness.  She has never had DEXA.  Past Medical History:  Diagnosis Date  . Allergy    pollen  . Anemia    IDA  . Cataract   . Hyperlipidemia   . Hypertension   . Migraine   . Prediabetes     Past Surgical History:  Procedure Laterality Date  . COLONOSCOPY    . COLONOSCOPY  2016  . COLONOSCOPY  07/15/2020  . DENTAL SURGERY  2021   STATES ALL TOP TEETH REMOVED  . HYSTEROTOMY    . UPPER GASTROINTESTINAL ENDOSCOPY  07/15/2020    Social History   Socioeconomic History  . Marital status: Single    Spouse name: Not on file  . Number of children: Not on file  . Years of education: Not on file  . Highest education level: Not on file  Occupational History  . Not on file  Tobacco Use  . Smoking status: Never Smoker  . Smokeless tobacco: Never Used  Vaping Use  . Vaping Use: Never used  Substance and Sexual Activity  . Alcohol use: No  . Drug use: No  . Sexual activity: Not on file  Other Topics Concern  . Not on file  Social History Narrative  . Not on file   Social Determinants of Health   Financial Resource Strain:   . Difficulty of Paying Living Expenses: Not on file  Food Insecurity:   . Worried About Charity fundraiser in the Last Year: Not on file  . Ran Out of Food in the Last Year: Not on file  Transportation Needs:   . Lack of Transportation (Medical): Not on file  . Lack of Transportation (Non-Medical): Not on file  Physical Activity:   . Days of  Exercise per Week: Not on file  . Minutes of Exercise per Session: Not on file  Stress:   . Feeling of Stress : Not on file  Social Connections:   . Frequency of Communication with Friends and Family: Not on file  . Frequency of Social Gatherings with Friends and Family: Not on file  . Attends Religious Services: Not on file  . Active Member of Clubs or Organizations: Not on file  . Attends Archivist Meetings: Not on file  . Marital Status: Not on file  Intimate Partner Violence:   . Fear of Current or Ex-Partner: Not on file  . Emotionally Abused: Not on file  . Physically Abused: Not on file  . Sexually Abused: Not on file    Current Outpatient Medications on File Prior to Visit  Medication Sig Dispense Refill  . aspirin EC 81 MG tablet Take 81 mg by mouth daily. Swallow whole.    Marland Kitchen atorvastatin (LIPITOR) 10 MG tablet Take 10 mg by mouth daily.    . [employer] PURELAX 17 GM/SCOOP powder Take by mouth.    . docusate sodium (COLACE) 100 MG capsule Take 100 mg by mouth 2 (two) times daily.    Marland Kitchen  ergocalciferol (VITAMIN D2) 50000 UNITS capsule Take 50,000 Units by mouth 2 (two) times a week. Monday and Friday    . ferrous sulfate 325 (65 FE) MG tablet Take 325 mg by mouth daily with breakfast.    . levocetirizine (XYZAL) 5 MG tablet Take 5 mg by mouth at bedtime.    . triamcinolone cream (KENALOG) 0.1 % SMARTSIG:Sparingly Topical Twice Daily     Current Facility-Administered Medications on File Prior to Visit  Medication Dose Route Frequency Provider Last Rate Last Admin  . 0.9 %  sodium chloride infusion  500 mL Intravenous Once Ladene Artist, MD        Allergies  Allergen Reactions  . Hydrocodone Itching    Family History  Problem Relation Age of Onset  . Stomach cancer Maternal Aunt   . Breast cancer Mother   . Heart disease Mother   . Lung cancer Mother   . Diabetes Mother   . Glaucoma Mother   . Cataracts Mother   . Asthma Mother   . Dementia Sister   .  Diabetes Sister   . Fibromyalgia Sister   . Cataracts Sister   . Colon cancer Neg Hx   . Rectal cancer Neg Hx   . Pancreatic cancer Neg Hx   . Colon polyps Neg Hx   . Esophageal cancer Neg Hx   . Hypercalcemia Neg Hx     BP 106/64   Pulse 72   Ht 5\' 2"  (1.575 m)   Wt 165 lb (74.8 kg)   SpO2 96%   BMI 30.18 kg/m   Review of Systems Denies diarrhea, polyuria, hematuria, depression, numbness, and back pain.  She has lost 30 lbs x 6 months--unintentional.       Objective:   Physical Exam  VS: see vs page GEN: no distress HEAD: head: no deformity eyes: no periorbital swelling, no proptosis external nose and ears are normal NECK: supple, thyroid is not enlarged CHEST WALL: no kyphosis.   LUNGS: clear to auscultation CV: reg rate and rhythm, no murmur.  MUSCULOSKELETAL: gait is normal and steady EXTEMITIES: no deformity.  no leg edema NEURO:  cn 2-12 grossly intact.   readily moves all 4's.  sensation is intact to touch on all 4's SKIN:  Normal texture and temperature.  No rash or suspicious lesion is visible.   NODES:  None palpable at the neck PSYCH: alert, well-oriented.  Does not appear anxious nor depressed.    Lab Results  Component Value Date   PTH 207 ([facility]) 05/26/2020   CALCIUM 13.7 ([facility]) 05/26/2020   I have reviewed outside records, and summarized: Pt was noted to have elevated Ca++, and referred here.  She was seen for weight loss, and other causes were considered.  She was sched for endoscopic eval      Assessment & Plan:  Hyperparathyroidism, prob primary Hypercalcemia, prob due to the above, and exac by HCTZ.   Patient Instructions  Blood tests are requested for you today.  We'll let you know about the results.  Please see a surgery specialist.  you will receive a phone call, about a day and time for an appointment. I have sent a prescription to your pharmacy, to change the lisinopril/HCTZ to just the lisinopril. Please come back for a follow-up  appointment in 2-3 weeks.

## 2020-08-05 ENCOUNTER — Telehealth: Payer: Self-pay | Admitting: Endocrinology

## 2020-08-05 NOTE — Telephone Encounter (Signed)
Our answering service just called to advise that [facility] just called to give CRITICAL LAB results for Crystal Foster.  They have requested a call back at [phone] REF #[id number]

## 2020-08-05 NOTE — Telephone Encounter (Signed)
Results given to provider to review.

## 2020-08-08 LAB — PTH-RELATED PEPTIDE: PTH-Related Protein (PTH-RP): 18 pg/mL (ref 11–20)

## 2020-08-08 LAB — PROTEIN ELECTROPHORESIS, SERUM
Albumin ELP: 3.6 g/dL — ABNORMAL LOW (ref 3.8–4.8)
Alpha 1: 0.4 g/dL — ABNORMAL HIGH (ref 0.2–0.3)
Alpha 2: 0.9 g/dL (ref 0.5–0.9)
Beta 2: 0.5 g/dL (ref 0.2–0.5)
Beta Globulin: 0.4 g/dL (ref 0.4–0.6)
Gamma Globulin: 1.4 g/dL (ref 0.8–1.7)
Total Protein: 7.2 g/dL (ref 6.1–8.1)

## 2020-08-08 LAB — VITAMIN A: Vitamin A (Retinoic Acid): 49 ug/dL (ref 38–98)

## 2020-08-18 ENCOUNTER — Other Ambulatory Visit: Payer: Self-pay

## 2020-08-18 ENCOUNTER — Encounter: Payer: Self-pay | Admitting: Endocrinology

## 2020-08-18 ENCOUNTER — Ambulatory Visit (INDEPENDENT_AMBULATORY_CARE_PROVIDER_SITE_OTHER): Payer: Medicare Other | Admitting: Endocrinology

## 2020-08-18 VITALS — BP 138/86 | HR 64 | Ht 62.0 in | Wt 170.0 lb

## 2020-08-18 DIAGNOSIS — E213 Hyperparathyroidism, unspecified: Secondary | ICD-10-CM

## 2020-08-18 LAB — BASIC METABOLIC PANEL
BUN: 19 mg/dL (ref 6–23)
CO2: 28 mEq/L (ref 19–32)
Calcium: 12.2 mg/dL — ABNORMAL HIGH (ref 8.4–10.5)
Chloride: 107 mEq/L (ref 96–112)
Creatinine, Ser: 1.43 mg/dL — ABNORMAL HIGH (ref 0.40–1.20)
GFR: 38.11 mL/min — ABNORMAL LOW (ref >60.00)
Glucose, Bld: 89 mg/dL (ref 70–99)
Potassium: 4.8 mEq/L (ref 3.5–5.1)
Sodium: 140 mEq/L (ref 135–145)

## 2020-08-18 NOTE — Patient Instructions (Addendum)
Blood tests are requested for you today.  We'll let you know about the results.  Let's check a special parathyroid x-ray.  you will receive a phone call, about a day and time for an appointment. Please continue the same medications.  Please come back for a follow-up appointment after you get out of the hospital.

## 2020-08-18 NOTE — Progress Notes (Signed)
Subjective:    Patient ID: Crystal Foster, female    DOB: 09-Mar-1951, 69 y.o.   MRN: [id number]  HPI Pt returns for f/u of primary hyperparathyroidism (dx'ed 2021; she has never had osteoporosis or urolithiasis; only bony fracture was right 3rd toe (many years ago, with injury); she takes vit-D, 50000 units BIW ; she has never had DEXA; she was ref surg).  since HCTZ was stopped, pt states she feels better in general. In particular, headache is resolved.   Past Medical History:  Diagnosis Date  . Allergy    pollen  . Anemia    IDA  . Cataract   . Hyperlipidemia   . Hypertension   . Migraine   . Prediabetes     Past Surgical History:  Procedure Laterality Date  . COLONOSCOPY    . COLONOSCOPY  2016  . COLONOSCOPY  07/15/2020  . DENTAL SURGERY  2021   STATES ALL TOP TEETH REMOVED  . HYSTEROTOMY    . UPPER GASTROINTESTINAL ENDOSCOPY  07/15/2020    Social History   Socioeconomic History  . Marital status: Single    Spouse name: Not on file  . Number of children: Not on file  . Years of education: Not on file  . Highest education level: Not on file  Occupational History  . Not on file  Tobacco Use  . Smoking status: Never Smoker  . Smokeless tobacco: Never Used  Vaping Use  . Vaping Use: Never used  Substance and Sexual Activity  . Alcohol use: No  . Drug use: No  . Sexual activity: Not on file  Other Topics Concern  . Not on file  Social History Narrative  . Not on file   Social Determinants of Health   Financial Resource Strain:   . Difficulty of Paying Living Expenses: Not on file  Food Insecurity:   . Worried About Charity fundraiser in the Last Year: Not on file  . Ran Out of Food in the Last Year: Not on file  Transportation Needs:   . Lack of Transportation (Medical): Not on file  . Lack of Transportation (Non-Medical): Not on file  Physical Activity:   . Days of Exercise per Week: Not on file  . Minutes of Exercise per Session: Not on file    Stress:   . Feeling of Stress : Not on file  Social Connections:   . Frequency of Communication with Friends and Family: Not on file  . Frequency of Social Gatherings with Friends and Family: Not on file  . Attends Religious Services: Not on file  . Active Member of Clubs or Organizations: Not on file  . Attends Archivist Meetings: Not on file  . Marital Status: Not on file  Intimate Partner Violence:   . Fear of Current or Ex-Partner: Not on file  . Emotionally Abused: Not on file  . Physically Abused: Not on file  . Sexually Abused: Not on file    Current Outpatient Medications on File Prior to Visit  Medication Sig Dispense Refill  . aspirin EC 81 MG tablet Take 81 mg by mouth daily. Swallow whole.    Marland Kitchen atorvastatin (LIPITOR) 10 MG tablet Take 10 mg by mouth daily.    . [employer] PURELAX 17 GM/SCOOP powder Take by mouth.    . docusate sodium (COLACE) 100 MG capsule Take 100 mg by mouth 2 (two) times daily.    . ferrous sulfate 325 (65 FE) MG tablet Take 325  mg by mouth daily with breakfast.    . levocetirizine (XYZAL) 5 MG tablet Take 5 mg by mouth at bedtime.    Marland Kitchen lisinopril (ZESTRIL) 20 MG tablet Take 1 tablet (20 mg total) by mouth daily. 90 tablet 3  . triamcinolone cream (KENALOG) 0.1 % SMARTSIG:Sparingly Topical Twice Daily     Current Facility-Administered Medications on File Prior to Visit  Medication Dose Route Frequency Provider Last Rate Last Admin  . 0.9 %  sodium chloride infusion  500 mL Intravenous Once Ladene Artist, MD        Allergies  Allergen Reactions  . Hydrocodone Itching    Family History  Problem Relation Age of Onset  . Stomach cancer Maternal Aunt   . Breast cancer Mother   . Heart disease Mother   . Lung cancer Mother   . Diabetes Mother   . Glaucoma Mother   . Cataracts Mother   . Asthma Mother   . Dementia Sister   . Diabetes Sister   . Fibromyalgia Sister   . Cataracts Sister   . Colon cancer Neg Hx   . Rectal cancer Neg  Hx   . Pancreatic cancer Neg Hx   . Colon polyps Neg Hx   . Esophageal cancer Neg Hx   . Hypercalcemia Neg Hx     BP 138/86   Pulse 64   Ht 5\' 2"  (1.575 m)   Wt 170 lb (77.1 kg)   SpO2 98%   BMI 31.09 kg/m    Review of Systems     Objective:   Physical Exam VITAL SIGNS:  See vs page GENERAL: no distress Ext: no leg edema.  Lab Results  Component Value Date   PTH 232 (H) 08/18/2020   CALCIUM 12.3 (H) 08/18/2020   CALCIUM 12.2 (H) 08/18/2020       Assessment & Plan:  Primary hyperparathyroidism, persistent. HTN: well-controlled off HCTZ.  Patient Instructions  Blood tests are requested for you today.  We'll let you know about the results.  Let's check a special parathyroid x-ray.  you will receive a phone call, about a day and time for an appointment. Please continue the same medications.  Please come back for a follow-up appointment after you get out of the hospital.

## 2020-08-19 LAB — PTH, INTACT AND CALCIUM
Calcium: 12.3 mg/dL — ABNORMAL HIGH (ref 8.6–10.4)
PTH: 232 pg/mL — ABNORMAL HIGH (ref 14–64)

## 2020-08-20 ENCOUNTER — Encounter: Payer: Self-pay | Admitting: Endocrinology

## 2020-08-20 ENCOUNTER — Other Ambulatory Visit: Payer: Self-pay | Admitting: Family

## 2020-08-20 DIAGNOSIS — Z1231 Encounter for screening mammogram for malignant neoplasm of breast: Secondary | ICD-10-CM

## 2020-08-20 MED ORDER — ERGOCALCIFEROL 1.25 MG (50000 UT) PO CAPS: 50000.0000 [IU] | ORAL_CAPSULE | ORAL | 0 refills | Status: AC

## 2020-08-21 ENCOUNTER — Other Ambulatory Visit: Payer: Self-pay

## 2020-08-21 ENCOUNTER — Ambulatory Visit
Admission: RE | Admit: 2020-08-21 | Discharge: 2020-08-21 | Disposition: A | Discharge status: Home / Self Care | Payer: Medicare Other | Source: Ambulatory Visit | Attending: Family | Admitting: Family

## 2020-08-21 DIAGNOSIS — Z1231 Encounter for screening mammogram for malignant neoplasm of breast: Secondary | ICD-10-CM

## 2020-09-08 ENCOUNTER — Other Ambulatory Visit (HOSPITAL_COMMUNITY): Payer: Self-pay | Admitting: Endocrinology

## 2020-09-08 ENCOUNTER — Other Ambulatory Visit (HOSPITAL_COMMUNITY): Payer: Self-pay | Admitting: Surgery

## 2020-09-08 ENCOUNTER — Other Ambulatory Visit: Payer: Self-pay | Admitting: Surgery

## 2020-09-08 DIAGNOSIS — E213 Hyperparathyroidism, unspecified: Secondary | ICD-10-CM

## 2020-09-18 ENCOUNTER — Ambulatory Visit (HOSPITAL_COMMUNITY)
Admission: RE | Admit: 2020-09-18 | Discharge: 2020-09-18 | Disposition: A | Discharge status: Home / Self Care | Payer: Medicare Other | Source: Ambulatory Visit | Attending: Surgery | Admitting: Surgery

## 2020-09-18 ENCOUNTER — Other Ambulatory Visit: Payer: Self-pay

## 2020-09-18 DIAGNOSIS — E213 Hyperparathyroidism, unspecified: Secondary | ICD-10-CM

## 2020-09-19 ENCOUNTER — Ambulatory Visit (HOSPITAL_COMMUNITY)
Admission: RE | Admit: 2020-09-19 | Discharge: 2020-09-19 | Disposition: A | Discharge status: Home / Self Care | Payer: Medicare Other | Source: Ambulatory Visit | Attending: Surgery | Admitting: Surgery

## 2020-09-19 DIAGNOSIS — E213 Hyperparathyroidism, unspecified: Secondary | ICD-10-CM | POA: Insufficient documentation

## 2020-09-19 MED ORDER — TECHNETIUM TC 99M SESTAMIBI - CARDIOLITE
25.3000 | Freq: Once | INTRAVENOUS | Status: AC | PRN
  Administered 2020-09-19: 07:00:00 25.3 via INTRAVENOUS

## 2020-10-14 ENCOUNTER — Other Ambulatory Visit: Payer: Self-pay | Admitting: Surgery

## 2020-10-14 DIAGNOSIS — E041 Nontoxic single thyroid nodule: Secondary | ICD-10-CM

## 2020-10-21 ENCOUNTER — Other Ambulatory Visit (HOSPITAL_COMMUNITY)
Admission: RE | Admit: 2020-10-21 | Discharge: 2020-10-21 | Disposition: A | Discharge status: Home / Self Care | Payer: Medicare Other | Source: Ambulatory Visit | Attending: Surgery | Admitting: Surgery

## 2020-10-21 ENCOUNTER — Ambulatory Visit
Admission: RE | Admit: 2020-10-21 | Discharge: 2020-10-21 | Disposition: A | Discharge status: Home / Self Care | Payer: Medicare Other | Source: Ambulatory Visit | Attending: Surgery | Admitting: Surgery

## 2020-10-21 DIAGNOSIS — D34 Benign neoplasm of thyroid gland: Secondary | ICD-10-CM | POA: Diagnosis not present

## 2020-10-21 DIAGNOSIS — E041 Nontoxic single thyroid nodule: Secondary | ICD-10-CM | POA: Diagnosis present

## 2020-10-22 LAB — CYTOLOGY - NON PAP

## 2020-10-23 ENCOUNTER — Ambulatory Visit: Payer: Self-pay | Admitting: Surgery

## 2020-10-23 NOTE — H&P (Signed)
General Surgery [facility]  Baldwin Crown DOB: [birth year] Single / Language: Cleophus Molt / Race: Black or African American Female   History of Present Illness  The patient is a 70 year old female who presents with primary hyperparathyroidism.  CHIEF COMPLAINT: primary hyperparathyroidism  Patient is referred by Dr. Renato Shin for surgical evaluation and management of suspected primary hyperparathyroidism. Patient had been noted to have elevated serum calcium levels. Recent levels have ranged from 12.2 to as high as 13.7. Patient had been taking hydrochlorothiazide and this is been discontinued by her endocrinologist. Levels had improved but it remained elevated greater than 12. Intact PTH level was markedly elevated at 232. Vitamin D level was normal at 54.9. Patient had not had any complications of hypercalcemia. She denies nephrolithiasis. She does have a mildly elevated creatinine. She does notice chronic fatigue. Patient denies any prior head or neck surgery. There is no family history of parathyroid disease or other endocrine neoplasm. Patient is accompanied today by her family members. Patient has not had any imaging studies performed.   Allergies HYDROcodone-Ibuprofen *ANALGESICS - OPIOID*  Allergies Reconciled   Medication History  Lisinopril (20MG  Tablet, Oral) Active. Ergocalciferol (50000IU Tablet, Oral) Active. Aspirin (81MG  Tablet, Oral) Active. Docusate Sodium (100MG  Capsule, Oral) Active. Ferrous Sulfate (325MG  Capsule, Oral) Active. Levocetirizine Dihydrochloride (5MG  Tablet, Oral) Active. [employer]S Purelax (17GM Packet, Oral) Active. Triamcinolone Acetonide (External) Specific strength unknown - Active. Atorvastatin Calcium (10MG  Tablet, Oral) Active. Medications Reconciled  Pregnancy / Birth History  Age at menarche  70 years.    Review of Systems  Female Genitourinary Present- Frequency. Not Present- Nocturia, Painful  Urination, Pelvic Pain and Urgency.  Vitals  Weight: 176.38 lb Height: 62in Body Surface Area: 1.81 m Body Mass Index: 32.26 kg/m  Temp.: 97.76F  Pulse: 73 (Regular)  BP: 160/82(Sitting, Left Arm, Standard)  Physical Exam   GENERAL APPEARANCE Development: normal Nutritional status: normal Gross deformities: none  SKIN Rash, lesions, ulcers: none Induration, erythema: none Nodules: none palpable  EYES Conjunctiva and lids: normal Pupils: equal and reactive Iris: normal bilaterally  EARS, NOSE, MOUTH, THROAT External ears: no lesion or deformity External nose: no lesion or deformity Hearing: grossly normal Due to Covid-19 pandemic, patient is wearing a mask.  NECK Symmetric: yes Trachea: midline Thyroid: no palpable nodules in the thyroid bed  CHEST Respiratory effort: normal Retraction or accessory muscle use: no Breath sounds: normal bilaterally Rales, rhonchi, wheeze: none  CARDIOVASCULAR Auscultation: regular rhythm, normal rate Murmurs: none Pulses: radial pulse 2+ palpable Lower extremity edema: none  MUSCULOSKELETAL Station and gait: normal Digits and nails: no clubbing or cyanosis Muscle strength: grossly normal all extremities Range of motion: grossly normal all extremities Deformity: none  LYMPHATIC Cervical: none palpable Supraclavicular: none palpable  PSYCHIATRIC Oriented to person, place, and time: yes Mood and affect: normal for situation Judgment and insight: appropriate for situation    Assessment & Plan   PRIMARY HYPERPARATHYROIDISM (E21.0)  Follow Up - Call [facility] office after tests / studies doneto discuss further plans  Patient is referred by her endocrinologist for surgical evaluation and recommendations regarding primary hyperparathyroidism.  Patient provided with a copy of "Parathyroid Surgery: Treatment for Your Parathyroid Gland Problem", published by Krames, 12 pages. Book reviewed and explained to  patient during visit today.  Patient does have biochemical evidence of hyperparathyroidism. Hypercalcemia improved slightly with discontinuation of hydrochlorothiazide, but has remained elevated at greater than 12. Patient has not had any imaging studies to date. A nuclear  medicine parathyroid scan has been ordered. We will add an ultrasound examination of the neck to be performed concurrently. Hopefully the sestamibi scan and ultrasound confirmed the diagnosis and localize the location of a parathyroid adenoma. If that is the case, the patient will be a good candidate for minimally invasive outpatient surgery. We discussed the procedure today. We discussed the size and location of the incision. We discussed doing this as an outpatient surgery under general anesthesia. If these studies do not localize the adenoma, then we will obtain a 4D CT scan of the neck in hopes of finding the parathyroid adenoma preoperatively.  Patient and her family understand and agree to proceed. We will contact her with the results of her studies. We will make a decision on how to proceed at that point in time.  Armandina Gemma, MD [facility] Office: [phone]

## 2020-11-16 ENCOUNTER — Encounter: Payer: Self-pay | Admitting: Surgery

## 2020-11-16 NOTE — H&P (Signed)
General Surgery [facility]  Baldwin Crown DOB: 26-Nov-1950 Single / Language: Crystal Foster / Race: Black or African American Female   History of Present Illness   The patient is a 70 year old female who presents with primary hyperparathyroidism.  CHIEF COMPLAINT: primary hyperparathyroidism  Patient is referred by Dr. Renato Shin for surgical evaluation and management of suspected primary hyperparathyroidism. Patient had been noted to have elevated serum calcium levels. Recent levels have ranged from 12.2 to as high as 13.7. Patient had been taking hydrochlorothiazide and this is been discontinued by her endocrinologist. Levels had improved but it remained elevated greater than 12. Intact PTH level was markedly elevated at 232. Vitamin D level was normal at 54.9. Patient had not had any complications of hypercalcemia. She denies nephrolithiasis. She does have a mildly elevated creatinine. She does notice chronic fatigue. Patient denies any prior head or neck surgery. There is no family history of parathyroid disease or other endocrine neoplasm. Patient is accompanied today by her family members. Patient has not had any imaging studies performed.   Allergies  HYDROcodone-Ibuprofen *ANALGESICS - OPIOID*  Allergies Reconciled   Medication History  Lisinopril (20MG  Tablet, Oral) Active. Ergocalciferol (50000IU Tablet, Oral) Active. Aspirin (81MG  Tablet, Oral) Active. Docusate Sodium (100MG  Capsule, Oral) Active. Ferrous Sulfate (325MG  Capsule, Oral) Active. Levocetirizine Dihydrochloride (5MG  Tablet, Oral) Active. CVS Purelax (17GM Packet, Oral) Active. Triamcinolone Acetonide (External) Specific strength unknown - Active. Atorvastatin Calcium (10MG  Tablet, Oral) Active. Medications Reconciled  Pregnancy / Birth History  Age at menarche  33 years.  Review of Systems  Female Genitourinary Present- Frequency. Not Present- Nocturia, Painful  Urination, Pelvic Pain and Urgency.  Vitals  Weight: 176.38 lb Height: 62in Body Surface Area: 1.81 m Body Mass Index: 32.26 kg/m  Temp.: 97.49F  Pulse: 73 (Regular)  BP: 160/82(Sitting, Left Arm, Standard)  Physical Exam    GENERAL APPEARANCE Development: normal Nutritional status: normal Gross deformities: none  SKIN Rash, lesions, ulcers: none Induration, erythema: none Nodules: none palpable  EYES Conjunctiva and lids: normal Pupils: equal and reactive Iris: normal bilaterally  EARS, NOSE, MOUTH, THROAT External ears: no lesion or deformity External nose: no lesion or deformity Hearing: grossly normal Due to Covid-19 pandemic, patient is wearing a mask.  NECK Symmetric: yes Trachea: midline Thyroid: no palpable nodules in the thyroid bed  CHEST Respiratory effort: normal Retraction or accessory muscle use: no Breath sounds: normal bilaterally Rales, rhonchi, wheeze: none  CARDIOVASCULAR Auscultation: regular rhythm, normal rate Murmurs: none Pulses: radial pulse 2+ palpable Lower extremity edema: none  MUSCULOSKELETAL Station and gait: normal Digits and nails: no clubbing or cyanosis Muscle strength: grossly normal all extremities Range of motion: grossly normal all extremities Deformity: none  LYMPHATIC Cervical: none palpable Supraclavicular: none palpable  PSYCHIATRIC Oriented to person, place, and time: yes Mood and affect: normal for situation Judgment and insight: appropriate for situation    Assessment & Plan   PRIMARY HYPERPARATHYROIDISM (E21.0)  Follow Up - Call [facility] office after tests / studies doneto discuss further plans  Patient is referred by her endocrinologist for surgical evaluation and recommendations regarding primary hyperparathyroidism.  Patient provided with a copy of "Parathyroid Surgery: Treatment for Your Parathyroid Gland Problem", published by Krames, 12 pages. Book reviewed and explained to  patient during visit today.  Patient does have biochemical evidence of hyperparathyroidism. Hypercalcemia improved slightly with discontinuation of hydrochlorothiazide, but has remained elevated at greater than 12. Patient has not had any imaging studies to date. A  nuclear medicine parathyroid scan has been ordered. We will add an ultrasound examination of the neck to be performed concurrently. Hopefully the sestamibi scan and ultrasound confirmed the diagnosis and localize the location of a parathyroid adenoma. If that is the case, the patient will be a good candidate for minimally invasive outpatient surgery. We discussed the procedure today. We discussed the size and location of the incision. We discussed doing this as an outpatient surgery under general anesthesia. If these studies do not localize the adenoma, then we will obtain a 4D CT scan of the neck in hopes of finding the parathyroid adenoma preoperatively.  Patient and her family understand and agree to proceed. We will contact her with the results of her studies. We will make a decision on how to proceed at that point in time.  ADDENDUM  Sestamibi scan localizes a likely left inferior parathyroid adenoma.  Right 3.3 cm thyroid nodule was found on USN.  Biopsy was performed and was consistent with a benign follicular nodule.  Plan to proceed with parathyroidectomy.  The risks and benefits of the procedure have been discussed at length with the patient.  The patient understands the proposed procedure, potential alternative treatments, and the course of recovery to be expected.  All of the patient's questions have been answered at this time.  The patient wishes to proceed with surgery.  Armandina Gemma, MD [facility] Office: [phone]

## 2020-11-17 NOTE — Patient Instructions (Signed)
DUE TO COVID-19 ONLY ONE VISITOR IS ALLOWED TO COME WITH YOU AND STAY IN THE WAITING ROOM ONLY DURING PRE OP AND PROCEDURE DAY OF SURGERY. THE 1 VISITOR  MAY VISIT WITH YOU AFTER SURGERY IN YOUR PRIVATE ROOM DURING VISITING HOURS ONLY!  YOU NEED TO HAVE A COVID 19 TEST ON: 11/22/20 @ 10:15 AM, THIS TEST MUST BE DONE BEFORE SURGERY,  COVID TESTING SITE [place] [address], IT IS ON THE RIGHT GOING OUT [address] APPROXIMATELY  2 MINUTES PAST [employer] ON THE RIGHT. ONCE YOUR COVID TEST IS COMPLETED,  PLEASE BEGIN THE QUARANTINE INSTRUCTIONS AS OUTLINED IN YOUR HANDOUT.                Crystal Foster    Your procedure is scheduled on: 11/25/20    Report to [facility] Main  Entrance   Report to admitting at: 12:30 PM     Call this number if you have problems the morning of surgery [phone]    Remember: Do not eat solid food :After Midnight. Clear liquids until: 11:30 am.  CLEAR LIQUID DIET  Foods Allowed                                                                     Foods Excluded  Coffee and tea, regular and decaf                             liquids that you cannot  Plain Jell-O any favor except red or purple                                           see through such as: Fruit ices (not with fruit pulp)                                     milk, soups, orange juice  Iced Popsicles                                    All solid food Carbonated beverages, regular and diet                                    Cranberry, grape and apple juices Sports drinks like Gatorade Lightly seasoned clear broth or consume(fat free) Sugar, honey syrup  Sample Menu Breakfast                                Lunch                                     Supper Cranberry juice  Beef broth                            Chicken broth Jell-O                                     Grape juice                           Apple juice Coffee or tea                         Jell-O                                      Popsicle                                                Coffee or tea                        Coffee or tea  _____________________________________________________________________  BRUSH YOUR TEETH MORNING OF SURGERY AND RINSE YOUR MOUTH OUT, NO CHEWING GUM CANDY OR MINTS.                               You may not have any metal on your body including hair pins and              piercings  Do not wear jewelry, make-up, lotions, powders or perfumes, deodorant             Do not wear nail polish on your fingernails.  Do not shave  48 hours prior to surgery.    Do not bring valuables to the hospital. [place].  Contacts, dentures or bridgework may not be worn into surgery.  Leave suitcase in the car. After surgery it may be brought to your room.     Patients discharged the day of surgery will not be allowed to drive home. IF YOU ARE HAVING SURGERY AND GOING HOME THE SAME DAY, YOU MUST HAVE AN ADULT TO DRIVE YOU HOME AND BE WITH YOU FOR 24 HOURS. YOU MAY GO HOME BY TAXI OR UBER OR ORTHERWISE, BUT AN ADULT MUST ACCOMPANY YOU HOME AND STAY WITH YOU FOR 24 HOURS.  Name and phone number of your driver:  Special Instructions: N/A              Please read over the following fact sheets you were given: _____________________________________________________________________          [facility] - Preparing for Surgery Before surgery, you can play an important role.  Because skin is not sterile, your skin needs to be as free of germs as possible.  You can reduce the number of germs on your skin by washing with CHG (chlorahexidine gluconate) soap before surgery.  CHG is an antiseptic cleaner which kills germs and bonds with the skin to continue killing germs even after washing.  Please DO NOT use if you have an allergy to CHG or antibacterial soaps.  If your skin becomes reddened/irritated stop using  the CHG and inform your nurse when you arrive at Short Stay. Do not shave (including legs and underarms) for at least 48 hours prior to the first CHG shower.  You may shave your face/neck. Please follow these instructions carefully:  1.  Shower with CHG Soap the night before surgery and the  morning of Surgery.  2.  If you choose to wash your hair, wash your hair first as usual with your  normal  shampoo.  3.  After you shampoo, rinse your hair and body thoroughly to remove the  shampoo.                           4.  Use CHG as you would any other liquid soap.  You can apply chg directly  to the skin and wash                       Gently with a scrungie or clean washcloth.  5.  Apply the CHG Soap to your body ONLY FROM THE NECK DOWN.   Do not use on face/ open                           Wound or open sores. Avoid contact with eyes, ears mouth and genitals (private parts).                       Wash face,  Genitals (private parts) with your normal soap.             6.  Wash thoroughly, paying special attention to the area where your surgery  will be performed.  7.  Thoroughly rinse your body with warm water from the neck down.  8.  DO NOT shower/wash with your normal soap after using and rinsing off  the CHG Soap.                9.  Pat yourself dry with a clean towel.            10.  Wear clean pajamas.            11.  Place clean sheets on your bed the night of your first shower and do not  sleep with pets. Day of Surgery : Do not apply any lotions/deodorants the morning of surgery.  Please wear clean clothes to the hospital/surgery center.  FAILURE TO FOLLOW THESE INSTRUCTIONS MAY RESULT IN THE CANCELLATION OF YOUR SURGERY PATIENT SIGNATURE_________________________________  NURSE SIGNATURE__________________________________  ________________________________________________________________________

## 2020-11-18 ENCOUNTER — Encounter (HOSPITAL_COMMUNITY): Payer: Self-pay

## 2020-11-18 ENCOUNTER — Encounter (HOSPITAL_COMMUNITY)
Admission: RE | Admit: 2020-11-18 | Discharge: 2020-11-18 | Disposition: A | Discharge status: Home / Self Care | Payer: Medicare Other | Source: Ambulatory Visit | Attending: Surgery | Admitting: Surgery

## 2020-11-18 ENCOUNTER — Other Ambulatory Visit: Payer: Self-pay

## 2020-11-18 DIAGNOSIS — Z01818 Encounter for other preprocedural examination: Secondary | ICD-10-CM | POA: Insufficient documentation

## 2020-11-18 LAB — BASIC METABOLIC PANEL
Anion gap: 6 (ref 5–15)
BUN: 18 mg/dL (ref 8–23)
CO2: 25 mmol/L (ref 22–32)
Calcium: 12.1 mg/dL — ABNORMAL HIGH (ref 8.9–10.3)
Chloride: 109 mmol/L (ref 98–111)
Creatinine, Ser: 1.22 mg/dL — ABNORMAL HIGH (ref 0.44–1.00)
GFR, Estimated: 49 mL/min — ABNORMAL LOW (ref >60)
Glucose, Bld: 98 mg/dL (ref 70–99)
Potassium: 4.2 mmol/L (ref 3.5–5.1)
Sodium: 140 mmol/L (ref 135–145)

## 2020-11-18 LAB — CBC
HCT: 34.6 % — ABNORMAL LOW (ref 36.0–46.0)
Hemoglobin: 11.1 g/dL — ABNORMAL LOW (ref 12.0–15.0)
MCH: 30.8 pg (ref 26.0–34.0)
MCHC: 32.1 g/dL (ref 30.0–36.0)
MCV: 96.1 fL (ref 80.0–100.0)
Platelets: 222 10*3/uL (ref 150–400)
RBC: 3.6 MIL/uL — ABNORMAL LOW (ref 3.87–5.11)
RDW: 14.9 % (ref 11.5–15.5)
WBC: 5.6 10*3/uL (ref 4.0–10.5)
nRBC: 0 % (ref 0.0–0.2)

## 2020-11-18 NOTE — Progress Notes (Signed)
COVID Vaccine Completed: Yes Date COVID Vaccine completed: 09/08/20 [place] COVID vaccine manufacturer: [place]    PCP - Priscille Loveless FNP Cardiologist - NO  Chest x-ray -  EKG -  Stress Test -  ECHO -  Cardiac Cath -  Pacemaker/ICD device last checked:  Sleep Study -  CPAP -   Fasting Blood Sugar -  Checks Blood Sugar _____ times a day  Blood Thinner Instructions: Aspirin Instructions: Last Dose:  Anesthesia review: Hx: HTN,pre-DIA  Patient denies shortness of breath, fever, cough and chest pain at PAT appointment   Patient verbalized understanding of instructions that were given to them at the PAT appointment. Patient was also instructed that they will need to review over the PAT instructions again at home before surgery.

## 2020-11-19 LAB — HEMOGLOBIN A1C
Hgb A1c MFr Bld: 5.6 % (ref 4.8–5.6)
Mean Plasma Glucose: 114 mg/dL

## 2020-11-22 ENCOUNTER — Encounter (HOSPITAL_COMMUNITY): Payer: Self-pay | Admitting: Surgery

## 2020-11-22 ENCOUNTER — Other Ambulatory Visit (HOSPITAL_COMMUNITY)
Admission: RE | Admit: 2020-11-22 | Discharge: 2020-11-22 | Disposition: A | Discharge status: Home / Self Care | Payer: Medicare Other | Source: Ambulatory Visit | Attending: Surgery | Admitting: Surgery

## 2020-11-22 DIAGNOSIS — Z20822 Contact with and (suspected) exposure to covid-19: Secondary | ICD-10-CM | POA: Diagnosis not present

## 2020-11-22 DIAGNOSIS — Z01812 Encounter for preprocedural laboratory examination: Secondary | ICD-10-CM | POA: Diagnosis present

## 2020-11-22 LAB — SARS CORONAVIRUS 2 (TAT 6-24 HRS): SARS Coronavirus 2: NEGATIVE

## 2020-11-25 ENCOUNTER — Ambulatory Visit (HOSPITAL_COMMUNITY): Payer: Medicare Other | Admitting: Anesthesiology

## 2020-11-25 ENCOUNTER — Ambulatory Visit (HOSPITAL_COMMUNITY)
Admission: RE | Admit: 2020-11-25 | Discharge: 2020-11-25 | Disposition: A | Discharge status: Home / Self Care | Payer: Medicare Other | Source: Ambulatory Visit | Attending: Surgery | Admitting: Surgery

## 2020-11-25 ENCOUNTER — Encounter (HOSPITAL_COMMUNITY): Payer: Self-pay | Admitting: Surgery

## 2020-11-25 ENCOUNTER — Encounter (HOSPITAL_COMMUNITY)
Admission: RE | Disposition: A | Discharge status: Home / Self Care | Payer: Self-pay | Source: Ambulatory Visit | Attending: Surgery

## 2020-11-25 DIAGNOSIS — E210 Primary hyperparathyroidism: Secondary | ICD-10-CM | POA: Diagnosis not present

## 2020-11-25 DIAGNOSIS — Z885 Allergy status to narcotic agent status: Secondary | ICD-10-CM | POA: Diagnosis not present

## 2020-11-25 DIAGNOSIS — Z886 Allergy status to analgesic agent status: Secondary | ICD-10-CM | POA: Insufficient documentation

## 2020-11-25 DIAGNOSIS — E213 Hyperparathyroidism, unspecified: Secondary | ICD-10-CM | POA: Diagnosis present

## 2020-11-25 DIAGNOSIS — E041 Nontoxic single thyroid nodule: Secondary | ICD-10-CM | POA: Insufficient documentation

## 2020-11-25 HISTORY — PX: PARATHYROIDECTOMY: SHX19

## 2020-11-25 SURGERY — PARATHYROIDECTOMY
Anesthesia: General | Site: Neck

## 2020-11-25 MED ORDER — BUPIVACAINE HCL (PF) 0.5 % IJ SOLN
INTRAMUSCULAR | Status: DC | PRN
  Administered 2020-11-25: 10 mL

## 2020-11-25 MED ORDER — SCOPOLAMINE 1 MG/3DAYS TD PT72
MEDICATED_PATCH | TRANSDERMAL | Status: AC
  Administered 2020-11-25: 1.5 mg via TRANSDERMAL
  Filled 2020-11-25: qty 1

## 2020-11-25 MED ORDER — AMISULPRIDE (ANTIEMETIC) 5 MG/2ML IV SOLN: 10.0000 mg | Freq: Once | INTRAVENOUS | Status: DC | PRN

## 2020-11-25 MED ORDER — CEFAZOLIN SODIUM-DEXTROSE 2-4 GM/100ML-% IV SOLN
2.0000 g | INTRAVENOUS | Status: AC
  Administered 2020-11-25: 2 g via INTRAVENOUS

## 2020-11-25 MED ORDER — SUGAMMADEX SODIUM 200 MG/2ML IV SOLN
INTRAVENOUS | Status: DC | PRN
  Administered 2020-11-25: 160 mg via INTRAVENOUS

## 2020-11-25 MED ORDER — BUPIVACAINE HCL (PF) 0.5 % IJ SOLN
INTRAMUSCULAR | Status: AC
  Filled 2020-11-25: qty 30

## 2020-11-25 MED ORDER — DEXAMETHASONE SODIUM PHOSPHATE 10 MG/ML IJ SOLN
INTRAMUSCULAR | Status: DC | PRN
  Administered 2020-11-25: 5 mg via INTRAVENOUS

## 2020-11-25 MED ORDER — LIDOCAINE HCL (CARDIAC) PF 100 MG/5ML IV SOSY
PREFILLED_SYRINGE | INTRAVENOUS | Status: DC | PRN
  Administered 2020-11-25: 100 mg via INTRAVENOUS

## 2020-11-25 MED ORDER — PROPOFOL 10 MG/ML IV BOLUS
INTRAVENOUS | Status: DC | PRN
  Administered 2020-11-25: 140 mg via INTRAVENOUS

## 2020-11-25 MED ORDER — CELECOXIB 200 MG PO CAPS
ORAL_CAPSULE | ORAL | Status: AC
  Administered 2020-11-25: 200 mg via ORAL
  Filled 2020-11-25: qty 1

## 2020-11-25 MED ORDER — MIDAZOLAM HCL 2 MG/2ML IJ SOLN
INTRAMUSCULAR | Status: DC | PRN
  Administered 2020-11-25: 2 mg via INTRAVENOUS

## 2020-11-25 MED ORDER — EPHEDRINE 5 MG/ML INJ
INTRAVENOUS | Status: AC
  Filled 2020-11-25: qty 10

## 2020-11-25 MED ORDER — FENTANYL CITRATE (PF) 100 MCG/2ML IJ SOLN: 25.0000 ug | INTRAMUSCULAR | Status: DC | PRN

## 2020-11-25 MED ORDER — MIDAZOLAM HCL 2 MG/2ML IJ SOLN
INTRAMUSCULAR | Status: AC
  Filled 2020-11-25: qty 2

## 2020-11-25 MED ORDER — FENTANYL CITRATE (PF) 250 MCG/5ML IJ SOLN
INTRAMUSCULAR | Status: DC | PRN
  Administered 2020-11-25: 50 ug via INTRAVENOUS
  Administered 2020-11-25 (×2): 100 ug via INTRAVENOUS

## 2020-11-25 MED ORDER — CELECOXIB 200 MG PO CAPS: 200.0000 mg | ORAL_CAPSULE | Freq: Once | ORAL | Status: AC

## 2020-11-25 MED ORDER — CHLORHEXIDINE GLUCONATE 0.12 % MT SOLN
15.0000 mL | Freq: Once | OROMUCOSAL | Status: AC
  Administered 2020-11-25: 15 mL via OROMUCOSAL

## 2020-11-25 MED ORDER — ACETAMINOPHEN 500 MG PO TABS: 1000.0000 mg | ORAL_TABLET | Freq: Once | ORAL | Status: AC

## 2020-11-25 MED ORDER — CHLORHEXIDINE GLUCONATE CLOTH 2 % EX PADS: 6.0000 | MEDICATED_PAD | Freq: Once | CUTANEOUS | Status: DC

## 2020-11-25 MED ORDER — FENTANYL CITRATE (PF) 250 MCG/5ML IJ SOLN
INTRAMUSCULAR | Status: AC
  Filled 2020-11-25: qty 5

## 2020-11-25 MED ORDER — LACTATED RINGERS IV SOLN: INTRAVENOUS | Status: DC

## 2020-11-25 MED ORDER — ROCURONIUM BROMIDE 10 MG/ML (PF) SYRINGE
PREFILLED_SYRINGE | INTRAVENOUS | Status: DC | PRN
  Administered 2020-11-25: 80 mg via INTRAVENOUS

## 2020-11-25 MED ORDER — ONDANSETRON HCL 4 MG/2ML IJ SOLN
INTRAMUSCULAR | Status: AC
  Filled 2020-11-25: qty 2

## 2020-11-25 MED ORDER — ORAL CARE MOUTH RINSE: 15.0000 mL | Freq: Once | OROMUCOSAL | Status: AC

## 2020-11-25 MED ORDER — SCOPOLAMINE 1 MG/3DAYS TD PT72: 1.0000 | MEDICATED_PATCH | TRANSDERMAL | Status: DC

## 2020-11-25 MED ORDER — ROCURONIUM BROMIDE 10 MG/ML (PF) SYRINGE
PREFILLED_SYRINGE | INTRAVENOUS | Status: AC
  Filled 2020-11-25: qty 10

## 2020-11-25 MED ORDER — LIDOCAINE HCL (PF) 2 % IJ SOLN
INTRAMUSCULAR | Status: AC
  Filled 2020-11-25: qty 5

## 2020-11-25 MED ORDER — EPHEDRINE SULFATE-NACL 50-0.9 MG/10ML-% IV SOSY
PREFILLED_SYRINGE | INTRAVENOUS | Status: DC | PRN
  Administered 2020-11-25: 10 mg via INTRAVENOUS

## 2020-11-25 MED ORDER — TRAMADOL HCL 50 MG PO TABS: 50.0000 mg | ORAL_TABLET | Freq: Four times a day (QID) | ORAL | 0 refills | Status: DC | PRN

## 2020-11-25 MED ORDER — PROPOFOL 10 MG/ML IV BOLUS
INTRAVENOUS | Status: AC
  Filled 2020-11-25: qty 20

## 2020-11-25 MED ORDER — 0.9 % SODIUM CHLORIDE (POUR BTL) OPTIME
TOPICAL | Status: DC | PRN
  Administered 2020-11-25: 1000 mL

## 2020-11-25 MED ORDER — CEFAZOLIN SODIUM-DEXTROSE 2-4 GM/100ML-% IV SOLN
INTRAVENOUS | Status: AC
  Filled 2020-11-25: qty 100

## 2020-11-25 MED ORDER — ONDANSETRON HCL 4 MG/2ML IJ SOLN
INTRAMUSCULAR | Status: DC | PRN
  Administered 2020-11-25: 4 mg via INTRAVENOUS

## 2020-11-25 MED ORDER — ACETAMINOPHEN 500 MG PO TABS
ORAL_TABLET | ORAL | Status: AC
  Administered 2020-11-25: 1000 mg via ORAL
  Filled 2020-11-25: qty 2

## 2020-11-25 SURGICAL SUPPLY — 33 items
ADH SKN CLS APL DERMABOND .7 (GAUZE/BANDAGES/DRESSINGS) ×1
APL PRP STRL LF DISP 70% ISPRP (MISCELLANEOUS) ×1
ATTRACTOMAT 16X20 MAGNETIC DRP (DRAPES) ×2 IMPLANT
BLADE SURG 15 STRL LF DISP TIS (BLADE) ×1 IMPLANT
BLADE SURG 15 STRL SS (BLADE) ×2
CHLORAPREP W/TINT 26 (MISCELLANEOUS) ×2 IMPLANT
CLIP VESOCCLUDE MED 6/CT (CLIP) ×4 IMPLANT
CLIP VESOCCLUDE SM WIDE 6/CT (CLIP) ×4 IMPLANT
COVER SURGICAL LIGHT HANDLE (MISCELLANEOUS) ×2 IMPLANT
COVER WAND RF STERILE (DRAPES) ×2 IMPLANT
DERMABOND ADVANCED (GAUZE/BANDAGES/DRESSINGS) ×1
DERMABOND ADVANCED .7 DNX12 (GAUZE/BANDAGES/DRESSINGS) ×1 IMPLANT
DRAPE LAPAROTOMY T 98X78 PEDS (DRAPES) ×2 IMPLANT
DRAPE UTILITY XL STRL (DRAPES) ×2 IMPLANT
ELECT REM PT RETURN 15FT ADLT (MISCELLANEOUS) ×2 IMPLANT
GAUZE 4X4 16PLY RFD (DISPOSABLE) ×2 IMPLANT
GLOVE SURG ORTHO LTX SZ8 (GLOVE) ×2 IMPLANT
GOWN STRL REUS W/TWL XL LVL3 (GOWN DISPOSABLE) ×6 IMPLANT
HEMOSTAT SURGICEL 2X4 FIBR (HEMOSTASIS) ×2 IMPLANT
ILLUMINATOR WAVEGUIDE N/F (MISCELLANEOUS) IMPLANT
KIT BASIN OR (CUSTOM PROCEDURE TRAY) ×2 IMPLANT
KIT TURNOVER KIT A (KITS) ×2 IMPLANT
NDL HYPO 25X1 1.5 SAFETY (NEEDLE) ×1 IMPLANT
NEEDLE HYPO 25X1 1.5 SAFETY (NEEDLE) ×2 IMPLANT
PACK BASIC VI WITH GOWN DISP (CUSTOM PROCEDURE TRAY) ×2 IMPLANT
PENCIL SMOKE EVACUATOR (MISCELLANEOUS) ×2 IMPLANT
SUT MNCRL AB 4-0 PS2 18 (SUTURE) ×2 IMPLANT
SUT VIC AB 3-0 SH 18 (SUTURE) ×2 IMPLANT
SYR BULB IRRIG 60ML STRL (SYRINGE) ×2 IMPLANT
SYR CONTROL 10ML LL (SYRINGE) ×2 IMPLANT
TOWEL OR 17X26 10 PK STRL BLUE (TOWEL DISPOSABLE) ×2 IMPLANT
TOWEL OR NON WOVEN STRL DISP B (DISPOSABLE) ×2 IMPLANT
TUBING CONNECTING 10 (TUBING) ×2 IMPLANT

## 2020-11-25 NOTE — Anesthesia Preprocedure Evaluation (Addendum)
Anesthesia Evaluation  Patient identified by MRN, date of birth, ID band Patient awake    Reviewed: Allergy & Precautions, NPO status , Patient's Chart, lab work & pertinent test results  History of Anesthesia Complications Negative for: history of anesthetic complications  Airway Mallampati: I  TM Distance: >3 FB Neck ROM: Full    Dental  (+) Edentulous Upper, Missing, Dental Advisory Given   Pulmonary neg pulmonary ROS,    Pulmonary exam normal        Cardiovascular hypertension, Pt. on medications Normal cardiovascular exam     Neuro/Psych negative neurological ROS     GI/Hepatic negative GI ROS, Neg liver ROS,   Endo/Other  negative endocrine ROS  Renal/GU Renal InsufficiencyRenal disease     Musculoskeletal negative musculoskeletal ROS (+)   Abdominal   Peds  Hematology negative hematology ROS (+)   Anesthesia Other Findings   Reproductive/Obstetrics                            Anesthesia Physical Anesthesia Plan  ASA: III  Anesthesia Plan: General   Post-op Pain Management:    Induction: Intravenous  PONV Risk Score and Plan: 4 or greater and Ondansetron, Midazolam, Dexamethasone and Treatment may vary due to age or medical condition  Airway Management Planned: Oral ETT  Additional Equipment:   Intra-op Plan:   Post-operative Plan: Extubation in OR  Informed Consent: I have reviewed the patients History and Physical, chart, labs and discussed the procedure including the risks, benefits and alternatives for the proposed anesthesia with the patient or authorized representative who has indicated his/her understanding and acceptance.     Dental advisory given  Plan Discussed with: Anesthesiologist, CRNA and Surgeon  Anesthesia Plan Comments:        Anesthesia Quick Evaluation

## 2020-11-25 NOTE — Interval H&P Note (Signed)
History and Physical Interval Note:  11/25/2020 1:53 PM  Crystal Foster  has presented today for surgery, with the diagnosis of PRIMARY HYPERPARATHYROIDISM.  The various methods of treatment have been discussed with the patient and family. After consideration of risks, benefits and other options for treatment, the patient has consented to    Procedure(s): PARATHYROIDECTOMY (N/A) as a surgical intervention.    The patient's history has been reviewed, patient examined, no change in status, stable for surgery.  I have reviewed the patient's chart and labs.  Questions were answered to the patient's satisfaction.    Armandina Gemma, MD [facility] Office: [place]

## 2020-11-25 NOTE — Op Note (Signed)
OPERATIVE REPORT - PARATHYROIDECTOMY  Preoperative diagnosis: Primary hyperparathyroidism  Postop diagnosis: Same  Procedure: Left minimally invasive parathyroidectomy  Surgeon:  Armandina Gemma, MD  Anesthesia: General endotracheal  Estimated blood loss: Minimal  Preparation: ChloraPrep  Indications: Patient is referred by Dr. Renato Shin for surgical evaluation and management of suspected primary hyperparathyroidism. Patient had been noted to have elevated serum calcium levels. Recent levels have ranged from 12.2 to as high as 13.7. Patient had been taking hydrochlorothiazide and this is been discontinued by her endocrinologist. Levels had improved but it remained elevated greater than 12. Intact PTH level was markedly elevated at 232. Vitamin D level was normal at 54.9.  Imaging studies were performed.  Nuclear medicine parathyroid scan suggested a left inferior parathyroid adenoma.  Patient now comes to surgery for neck exploration and parathyroidectomy.  Procedure: The patient was prepared in the pre-operative holding area. The patient was brought to the operating room and placed in a supine position on the operating room table. Following administration of general anesthesia, the patient was positioned and then prepped and draped in the usual strict aseptic fashion. After ascertaining that an adequate level of anesthesia been achieved, a neck incision was made with a #15 blade. Dissection was carried through subcutaneous tissues and platysma. Hemostasis was obtained with the electrocautery. Skin flaps were developed circumferentially and a Weitlander retractor was placed for exposure.  Strap muscles were incised in the midline. Strap muscles were reflected laterally exposing the left anterior neck.  Exploration revealed an enlarged glandular mass which had the appearance of an enlarged parathyroid gland.  No thyroid tissue was identified on gross evaluation.  This mass was mobilized away  from the underlying esophagus.  The recurrent laryngeal nerve was dissected out for approximately 3 cm and injury was avoided.  Vascular structures were divided between small ligaclips and the mass was excised.  It measured approximately 3.5 cm in greatest dimension.  It was submitted to pathology where frozen section confirmed hypercellular parathyroid tissue consistent with parathyroid adenoma.  Further exploration in the left neck revealed additional tissue just above the level of the previously removed mass.  This tissue measured approximately 1.5 cm in size and also had the appearance of an enlarged parathyroid gland consistent with adenoma.  Therefore it was dissected out.  Vascular tributaries were divided between small ligaclips and the mass was excised.  It was submitted to pathology for permanent evaluation labeled as additional left neck parathyroid tissue.  This was discussed with pathology during the case.  No significant thyroid tissue was identified in the left neck.  Neck was irrigated with warm saline and good hemostasis was noted. Fibrillar was placed in the operative field. Strap muscles were approximated in the midline with interrupted 3-0 Vicryl sutures. Platysma was closed with interrupted 3-0 Vicryl sutures. Marcaine was infiltrated circumferentially. Skin was closed with a running 4-0 Monocryl subcuticular suture. Wound was washed and dried and Dermabond was applied. Patient was awakened from anesthesia and brought to the recovery room. The patient tolerated the procedure well.   Armandina Gemma, MD [facility] Office: [phone]

## 2020-11-25 NOTE — Anesthesia Procedure Notes (Signed)
Procedure Name: Intubation Date/Time: 11/25/2020 2:15 PM Performed by: Raenette Rover, CRNA Pre-anesthesia Checklist: Patient identified, Emergency Drugs available, Suction available and Patient being monitored Patient Re-evaluated:Patient Re-evaluated prior to induction Oxygen Delivery Method: Circle system utilized Preoxygenation: Pre-oxygenation with 100% oxygen Induction Type: IV induction Ventilation: Mask ventilation without difficulty Laryngoscope Size: Mac and 3 Grade View: Grade I Tube type: Oral Tube size: 7.0 mm Number of attempts: 1 Airway Equipment and Method: Stylet Placement Confirmation: ETT inserted through vocal cords under direct vision,  positive ETCO2 and breath sounds checked- equal and bilateral Secured at: 21 cm Tube secured with: Tape Dental Injury: Teeth and Oropharynx as per pre-operative assessment

## 2020-11-25 NOTE — Discharge Instructions (Signed)
[facility]  THYROID & PARATHYROID SURGERY:  POST-OP INSTRUCTIONS  Always review your discharge instruction sheet from the facility where your surgery was performed.  A prescription for pain medication may be given to you upon discharge.  Take your pain medication as prescribed.  If narcotic pain medicine is not needed, then you may take acetaminophen (Tylenol) or ibuprofen (Advil) as needed.  Take your usually prescribed medications unless otherwise directed.  If you need a refill on your pain medication, please contact our office during regular business hours.  Prescriptions cannot be processed by our office after 5 pm or on weekends.  Start with a light diet upon arrival home, such as soup and crackers or toast.  Be sure to drink plenty of fluids daily.  Resume your normal diet the day after surgery.  Most patients will experience some swelling and bruising on the chest and neck area.  Ice packs will help.  Swelling and bruising can take several days to resolve.   It is common to experience some constipation after surgery.  Increasing fluid intake and taking a stool softener (Colace) will usually help or prevent this problem.  A mild laxative (Milk of Magnesia or Miralax) should be taken according to package directions if there has been no bowel movement after 48 hours.  You have steri-strips and a gauze dressing over your incision.  You may remove the gauze bandage on the second day after surgery, and you may shower at that time.  Leave your steri-strips (small skin tapes) in place directly over the incision.  These strips should remain on the skin for 5-7 days and then be removed.  You may get them wet in the shower and pat them dry.  You may resume regular (light) daily activities beginning the next day (such as daily self-care, walking, climbing stairs) gradually increasing activities as tolerated.  You may have sexual intercourse when it is comfortable.  Refrain from  any heavy lifting or straining until approved by your doctor.  You may drive when you no longer are taking prescription pain medication, you can comfortably wear a seatbelt, and you can safely maneuver your car and apply brakes.  You should see your doctor in the office for a follow-up appointment approximately three weeks after your surgery.  Make sure that you call for this appointment within a day or two after you arrive home to insure a convenient appointment time.  WHEN TO CALL YOUR DOCTOR: -- Fever greater than 101.5 -- Inability to urinate -- Nausea and/or vomiting - persistent -- Extreme swelling or bruising -- Continued bleeding from incision -- Increased pain, redness, or drainage from the incision -- Difficulty swallowing or breathing -- Muscle cramping or spasms -- Numbness or tingling in hands or around lips  The clinic staff is available to answer your questions during regular business hours.  Please don't hesitate to call and ask to speak to one of the nurses if you have concerns.  Orlanda Frankum, MD [facility] Office: [phone] 

## 2020-11-25 NOTE — Transfer of Care (Signed)
Immediate Anesthesia Transfer of Care Note  Patient: Crystal Foster  Procedure(s) Performed: PARATHYROIDECTOMY (N/A Neck)  Patient Location: PACU  Anesthesia Type:General  Level of Consciousness: awake, drowsy and patient cooperative  Airway & Oxygen Therapy: Patient Spontanous Breathing and Patient connected to face mask oxygen  Post-op Assessment: Report given to RN and Post -op Vital signs reviewed and stable  Post vital signs: Reviewed and stable  Last Vitals:  Vitals Value Taken Time  BP 159/84 11/25/20 1530  Temp    Pulse 67 11/25/20 1530  Resp 11 11/25/20 1530  SpO2 100 % 11/25/20 1530  Vitals shown include unvalidated device data.  Last Pain:  Vitals:   11/25/20 1253  TempSrc:   PainSc: 0-No pain         Complications: No complications documented.

## 2020-11-25 NOTE — Anesthesia Postprocedure Evaluation (Signed)
Anesthesia Post Note  Patient: Crystal Foster  Procedure(s) Performed: PARATHYROIDECTOMY (N/A Neck)     Patient location during evaluation: PACU Anesthesia Type: General Level of consciousness: sedated Pain management: pain level controlled Vital Signs Assessment: post-procedure vital signs reviewed and stable Respiratory status: spontaneous breathing and respiratory function stable Cardiovascular status: stable Postop Assessment: no apparent nausea or vomiting Anesthetic complications: no   No complications documented.  Last Vitals:  Vitals:   11/25/20 1600 11/25/20 1615  BP: (!) 164/80 (!) 157/103  Pulse: (!) 59 61  Resp: 15 14  Temp:  (!) 36.4 C  SpO2: 100% 99%    Last Pain:  Vitals:   11/25/20 1615  TempSrc:   PainSc: 0-No pain                 [name] DANIEL

## 2020-11-26 ENCOUNTER — Encounter (HOSPITAL_COMMUNITY): Payer: Self-pay | Admitting: Surgery

## 2020-11-26 LAB — SURGICAL PATHOLOGY

## 2020-12-03 ENCOUNTER — Encounter (HOSPITAL_COMMUNITY): Payer: Self-pay | Admitting: Surgery

## 2020-12-03 NOTE — Addendum Note (Signed)
Addendum  created 12/03/20 1402 by Duane Boston, MD   Intraprocedure Event edited, Intraprocedure Staff edited

## 2021-04-23 IMAGING — US US THYROID
1 series · 13 of 25 positions shown · non-contrast
Comparison: None.

CLINICAL DATA: Other.  Hyperparathyroidism.

EXAM:
THYROID ULTRASOUND
TECHNIQUE: Ultrasound examination of the thyroid gland and adjacent soft
tissues was performed.

[Series 1: us thyroid · 49 acquisitions, 13 frames shown]
[im 1/49]
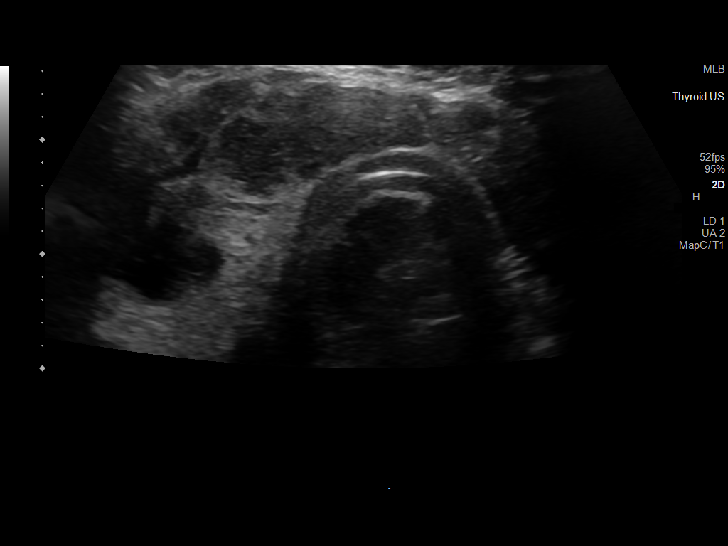
[im 5/49]
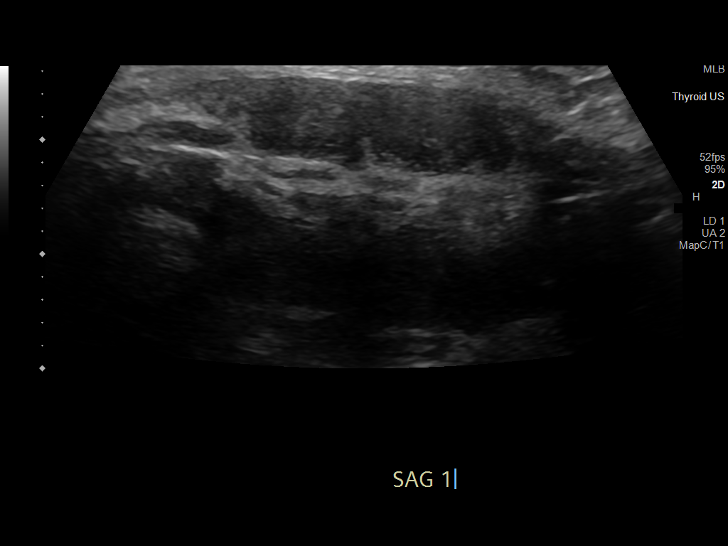
[im 9/49]
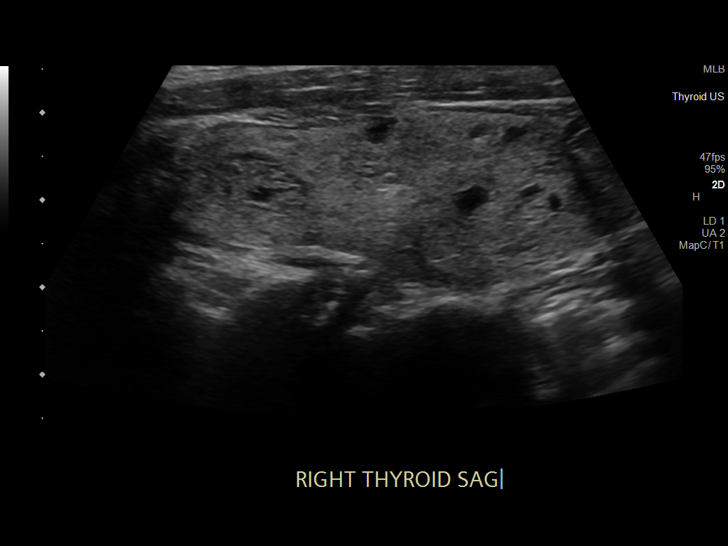
[im 13/49]
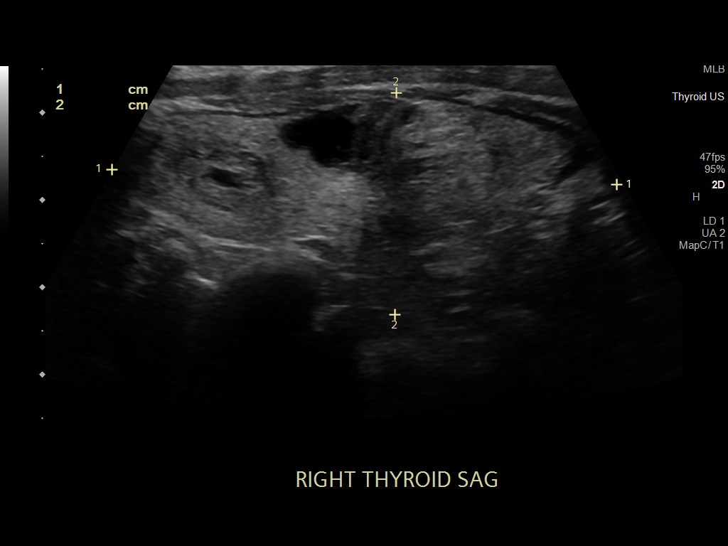
[im 17/49]
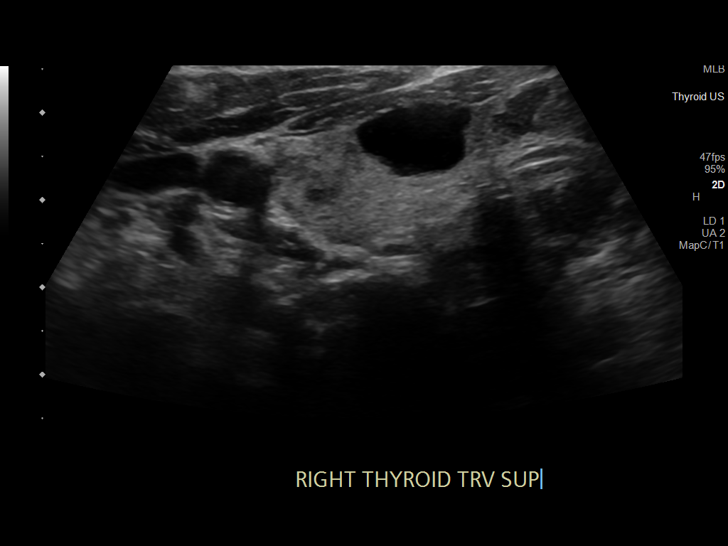
[im 21/49]
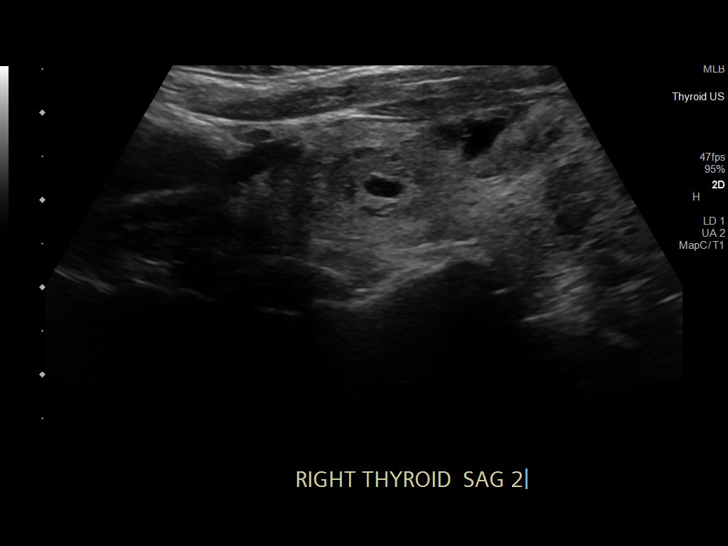
[im 25/49]
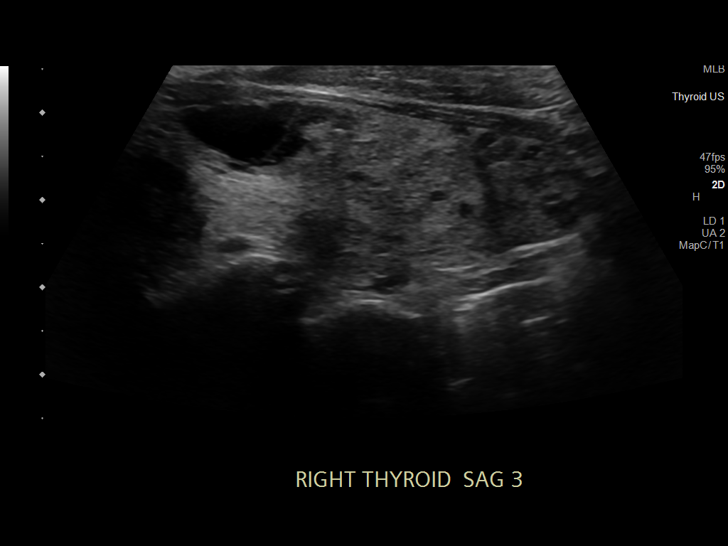
[im 29/49]
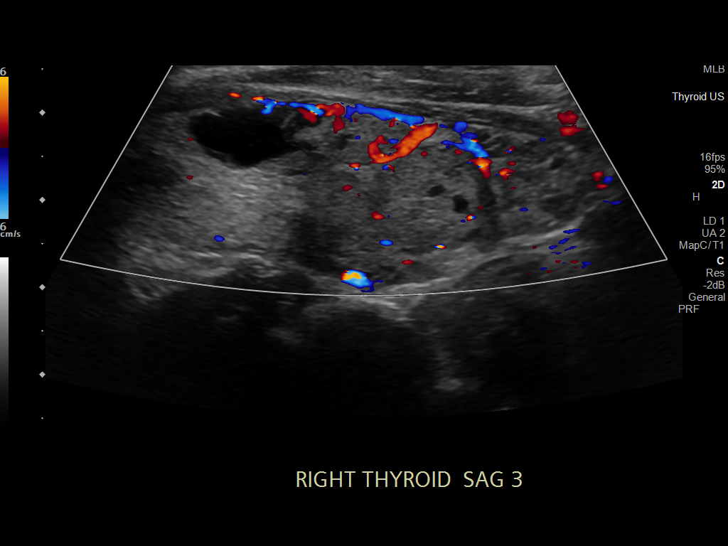
[im 33/49]
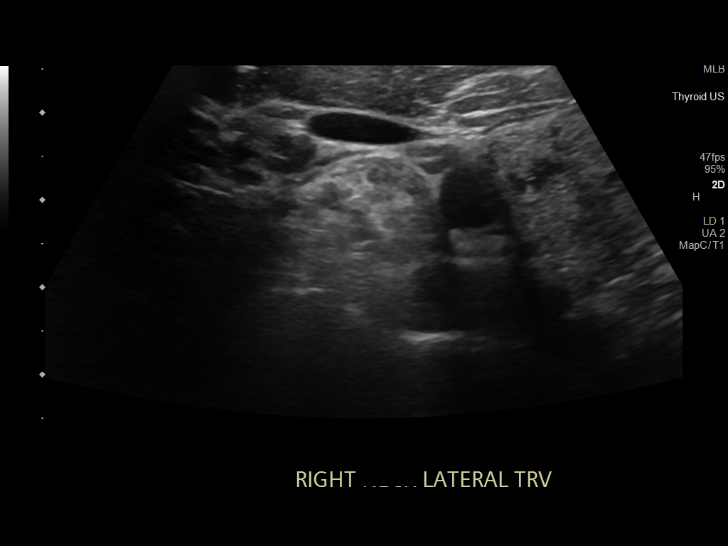
[im 37/49]
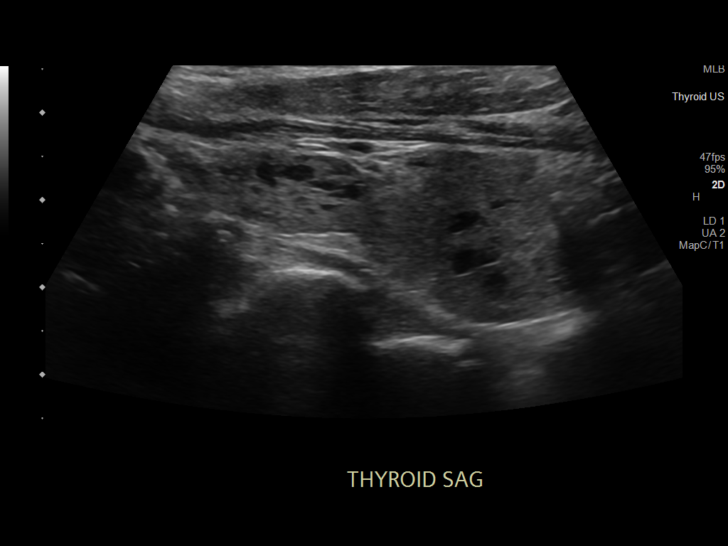
[im 41/49]
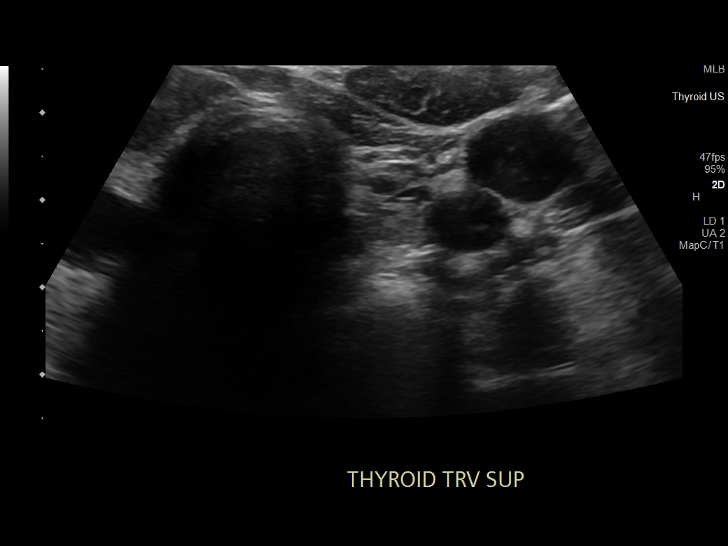
[im 45/49]
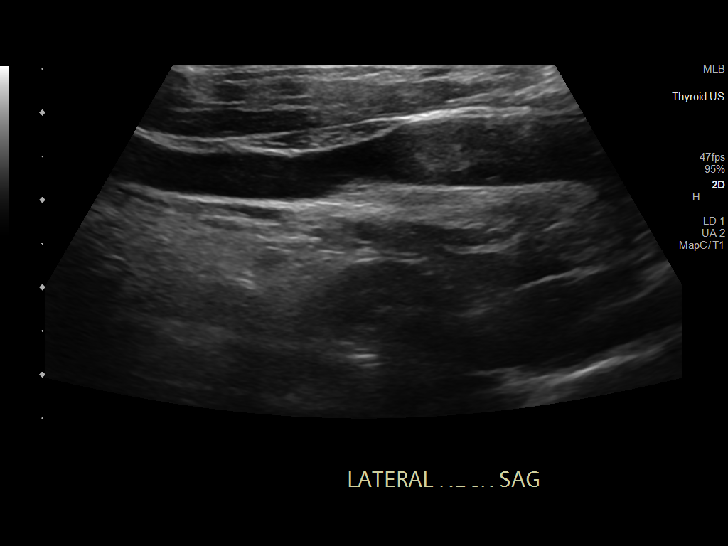
[im 49/49]
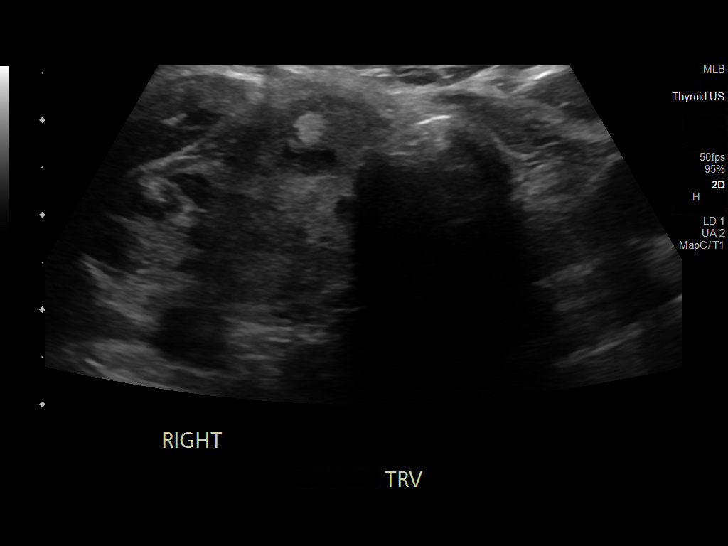

[13 of 25 positions shown; findings below may reference images not displayed]

FINDINGS: Parenchymal Echotexture: Markedly heterogenous

Isthmus: Normal in size measures 0.5 cm in diameter

Right lobe: Borderline enlarged measuring 5.8 x 2.5 x 3.0 cm

Left lobe: Slightly atrophic in size measuring 4.9 x 1.8 x 1.1 cm

_________________________________________________________

Estimated total number of nodules >/= 1 cm: 3

Number of spongiform nodules >/=  2 cm not described below (TR1): 0

Number of mixed cystic and solid nodules >/= 1.5 cm not described
below (TR2): 0

_________________________________________________________

Nodule # 1:

Location: Isthmus; Mid

Maximum size: 2.4 cm; Other 2 dimensions: 1.9 x 0.7 cm

Composition: solid/almost completely solid (2)

Echogenicity: hypoechoic (2)

Shape: not taller-than-wide (0)

Margins: ill-defined (0)

Echogenic foci: none (0)

ACR TI-RADS total points: 4.

ACR TI-RADS risk category: TR4 (4-6 points).

ACR TI-RADS recommendations:

**Given size (>/= 1.5 cm) and appearance, fine needle aspiration of
this moderately suspicious nodule should be considered based on
TI-RADS criteria.

_________________________________________________________

There is an approximately 1.3 x 1.2 x 0.9 cm isoechoic partially
cystic predominantly solid ill-defined nodule/pseudonodule within
the mid aspect the right lobe of the thyroid (labeled 2), which does
not meet criteria to recommend percutaneous sampling or continued
dedicated follow-up.

There is an approximately 2.0 cm minimally complex cyst within mid
aspect the right lobe of the thyroid which does not meet imaging
criteria to recommend percutaneous sampling or continued dedicated
follow-up.

Questioned 3.3 x 2.4 x 1.6 cm isoechoic ill-defined nodule within
the inferior pole the right lobe of the thyroid is favored to
represent a pseudonodule as it lacks defined borders on both
provided transverse and sagittal images.

No discrete extra thyroidal nodules are identified.
IMPRESSION: 1. Findings suggestive of multinodular goiter, asymmetrically
affecting the right lobe of the thyroid.
2. Nodule #1 meets imaging criteria to recommend percutaneous
sampling as indicated.
3. None of the other discretely measured nodules/pseudo nodules meet
imaging criteria to recommend percutaneous sampling or continued
dedicated follow-up.
4. No definitive extra thyroid nodule nodules are identified to
suggest the presence of a parathyroid adenoma. Further evaluation
with nuclear medicine parathyroid scintigraphy could be performed as
indicated.

The above is in keeping with the ACR TI-RADS recommendations - [HOSPITAL] 6737;[DATE].

## 2021-05-26 IMAGING — US US FNA BIOPSY THYROID 1ST LESION
2 series · 13 of 25 positions shown · non-contrast
Comparison: Ultrasound thyroid September 18, 2020

MEDICATIONS:
1% lidocaine 3 mL

COMPLICATIONS:
None immediate.

INDICATION: Indeterminate right inferior thyroid nodule

EXAM:
ULTRASOUND GUIDED FINE NEEDLE ASPIRATION OF INDETERMINATE THYROID
NODULE
TECHNIQUE: Informed written consent was obtained from the patient after a
discussion of the risks, benefits and alternatives to treatment.
Questions regarding the procedure were encouraged and answered. A
timeout was performed prior to the initiation of the procedure.

[Series 1: us fna biopsy thyroid 1st lesion · 0.06mm/px · 10 acquisitions, 5 frames shown (1 of 2)]
[im 1/10]
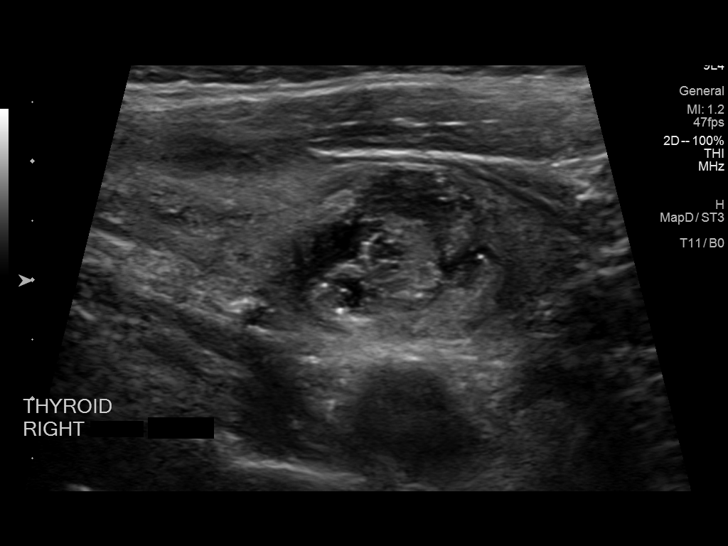
[im 3/10]
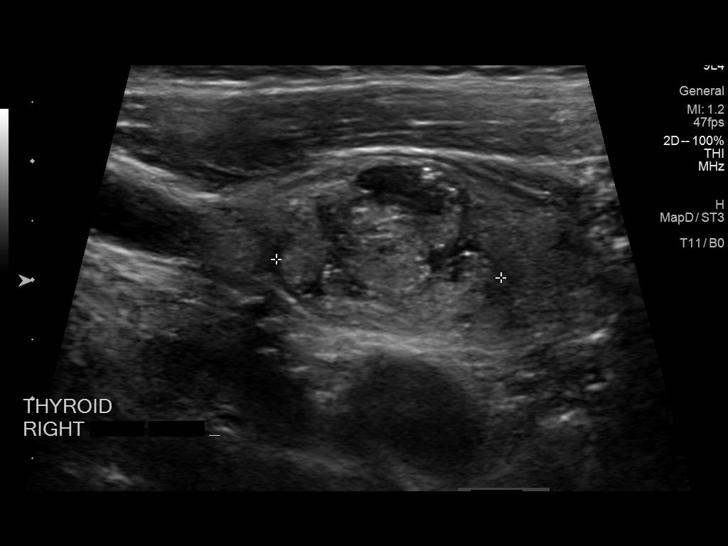
[im 5/10]
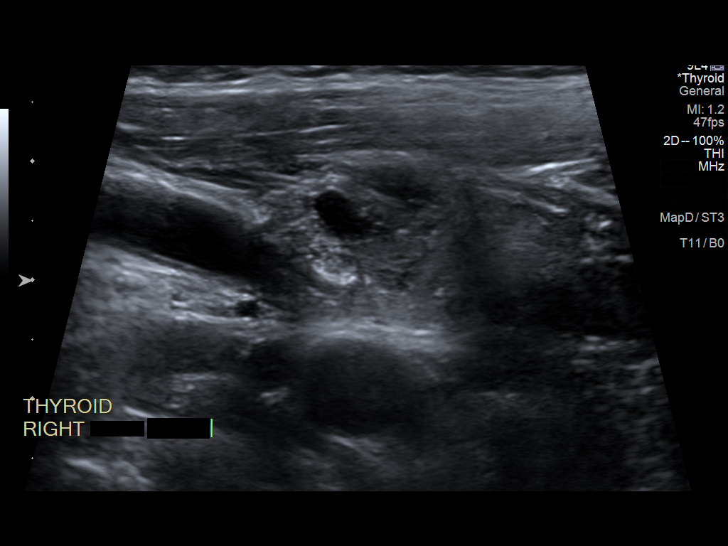
[im 7/10]
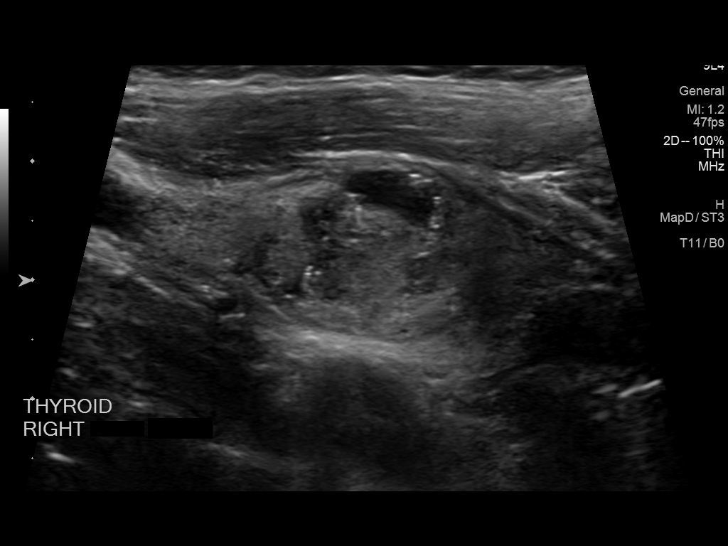
[im 10/10]
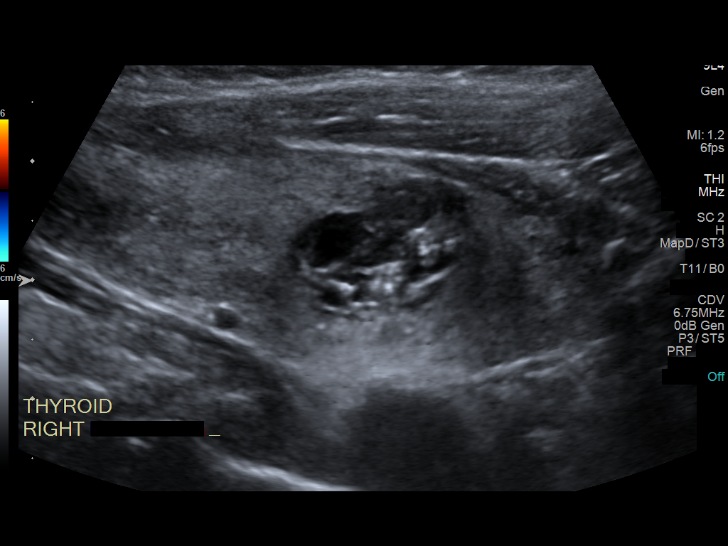

[Series 2: us fna biopsy thyroid 1st lesion · 17 acquisitions, 8 frames shown (2 of 2)]
[im 2/17]
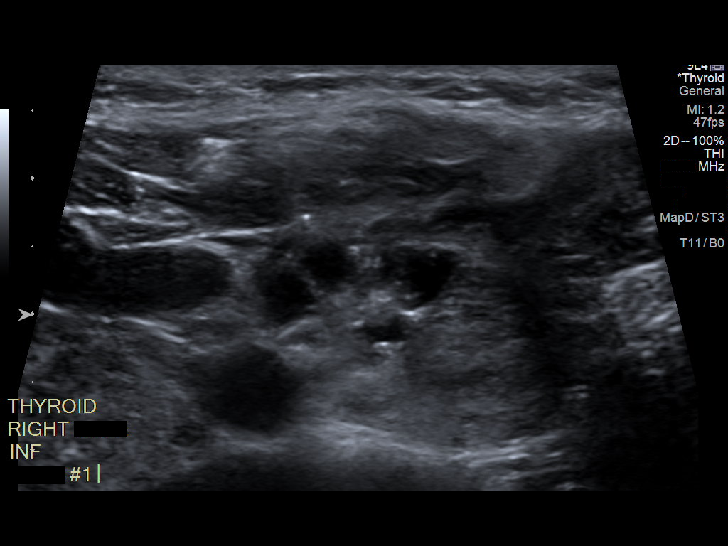
[im 4/17]
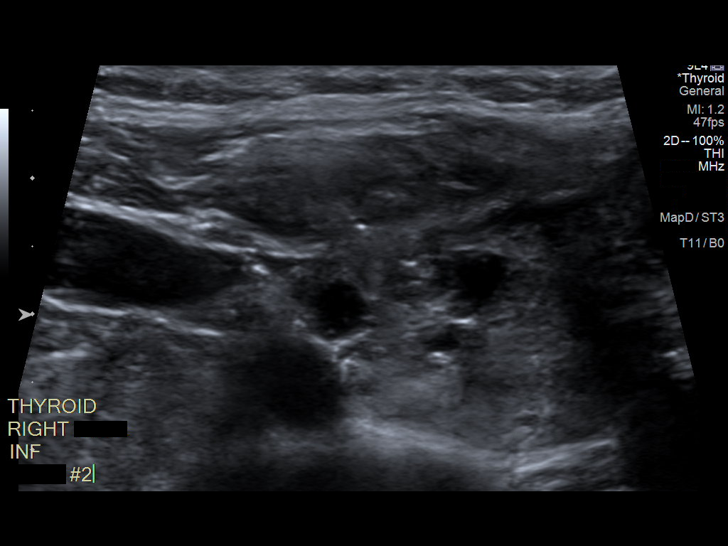
[im 6/17]
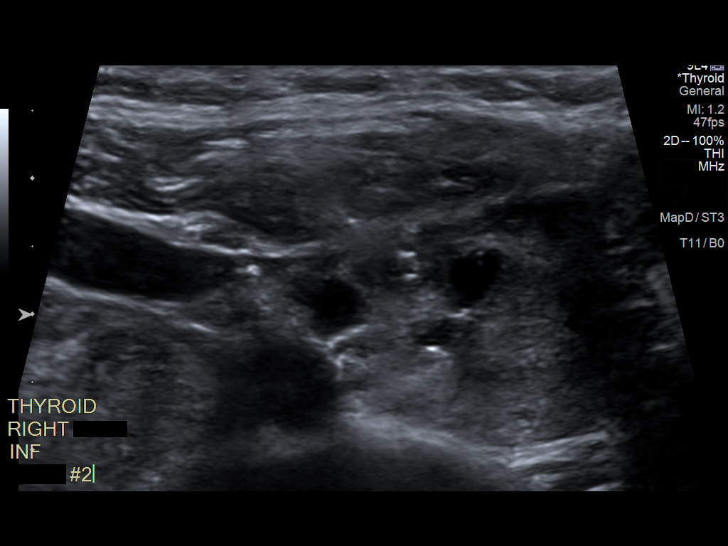
[im 8/17]
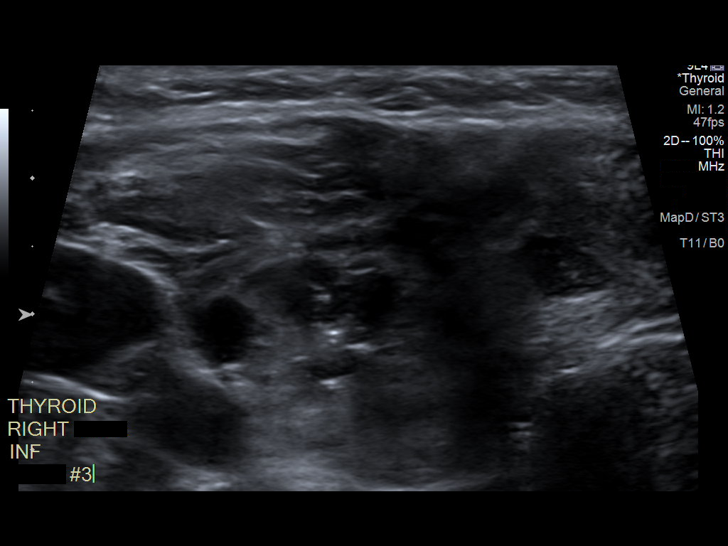
[im 10/17]
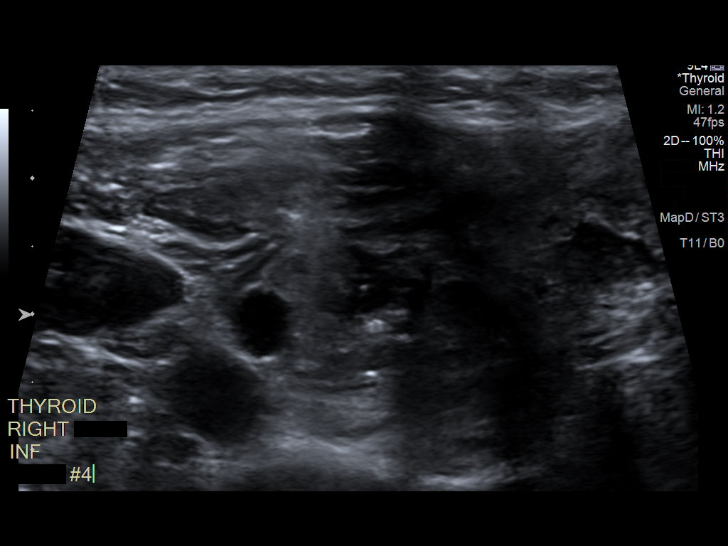
[im 12/17]
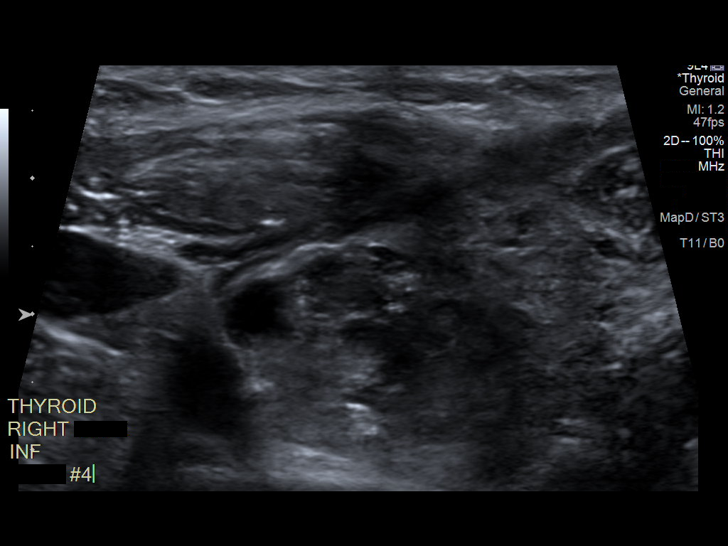
[im 14/17]
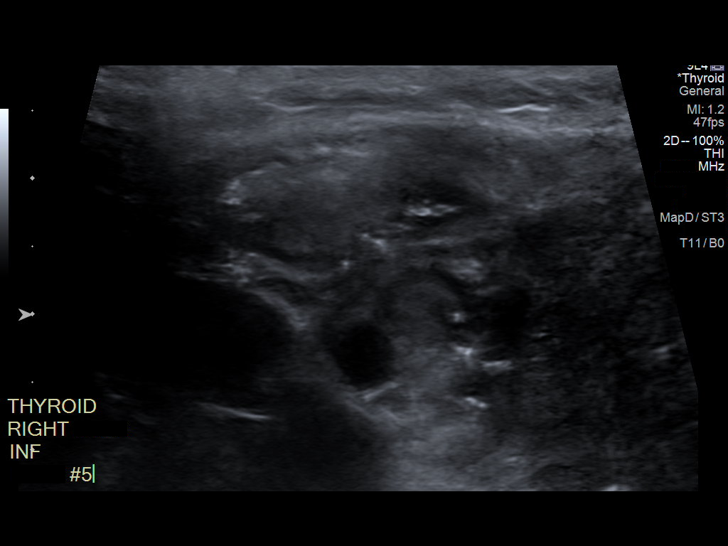
[im 17/17]
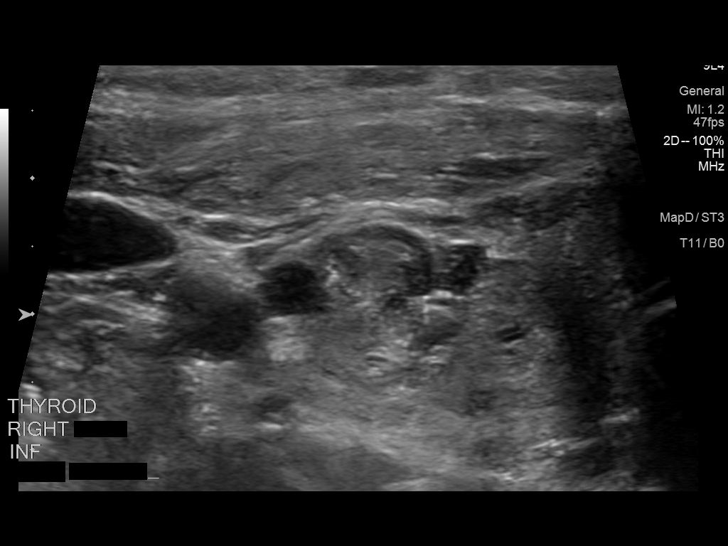

[13 of 25 positions shown; findings below may reference images not displayed]

Pre-procedural ultrasound scanning demonstrated unchanged size and
appearance of the indeterminate nodule within the right inferior
thyroid

The procedure was planned. The neck was prepped in the usual sterile
fashion, and a sterile drape was applied covering the operative
field. A timeout was performed prior to the initiation of the
procedure. Local anesthesia was provided with 1% lidocaine.

Under direct ultrasound guidance, 5 FNA biopsies were performed of
the right inferior thyroid nodule with a 25 gauge needle. Multiple
ultrasound images were saved for procedural documentation purposes.
The samples were prepared and submitted to pathology. Two of these
samples were prepared for Afirma testing.

Limited post procedural scanning was negative for hematoma or
additional complication. Dressings were placed. The patient
tolerated the above procedures procedure well without immediate
postprocedural complication.
FINDINGS: Nodule reference number based on prior diagnostic ultrasound: The
nodule is not labeled with a number

Maximum size: 3.3 cm

Location: Right; Inferior

ACR TI-RADS risk category: No TI-RADS risk category identified.

Reason for biopsy: meets other recommendations

Ultrasound imaging confirms appropriate placement of the needles
within the thyroid nodule.
IMPRESSION: Technically successful ultrasound guided fine needle aspiration of
right inferior thyroid nodule. Read by: Jiayue De Silva, NP

## 2021-09-08 ENCOUNTER — Other Ambulatory Visit: Payer: Self-pay | Admitting: Nurse Practitioner

## 2021-09-08 DIAGNOSIS — Z1231 Encounter for screening mammogram for malignant neoplasm of breast: Secondary | ICD-10-CM

## 2021-09-14 ENCOUNTER — Ambulatory Visit
Admission: RE | Admit: 2021-09-14 | Discharge: 2021-09-14 | Disposition: A | Discharge status: Home / Self Care | Payer: Medicare Other | Source: Ambulatory Visit | Attending: Nurse Practitioner | Admitting: Nurse Practitioner

## 2021-09-14 DIAGNOSIS — Z1231 Encounter for screening mammogram for malignant neoplasm of breast: Secondary | ICD-10-CM

## 2021-10-19 ENCOUNTER — Other Ambulatory Visit (HOSPITAL_COMMUNITY): Payer: Self-pay

## 2022-04-19 IMAGING — MG MM DIGITAL SCREENING BILAT W/ TOMO AND CAD
8 series · 8 of 24 positions shown · non-contrast
Comparison: Previous exam(s).

CLINICAL DATA: Screening.

EXAM:
DIGITAL SCREENING BILATERAL MAMMOGRAM WITH TOMOSYNTHESIS AND CAD
TECHNIQUE: Bilateral screening digital craniocaudal and mediolateral oblique
mammograms were obtained. Bilateral screening digital breast
tomosynthesis was performed. The images were evaluated with
computer-aided detection.

[L MLO synth-2D]
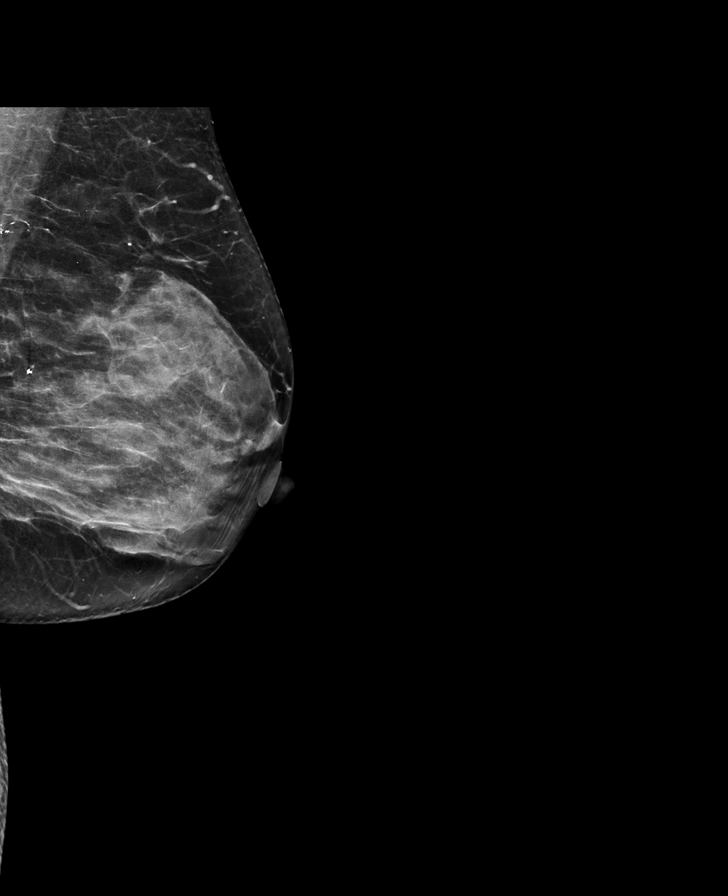

[R MLO synth-2D]
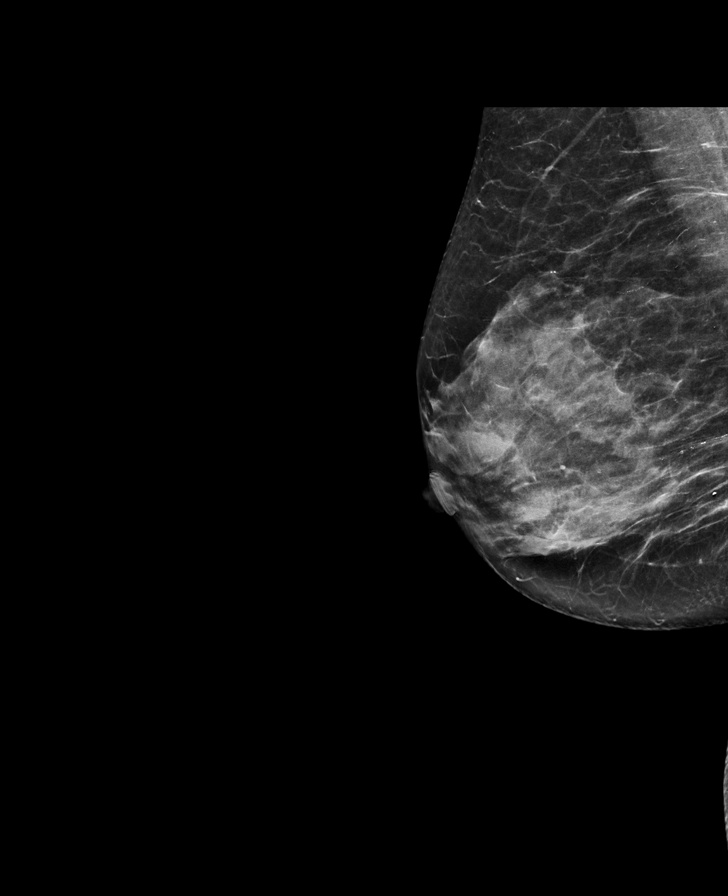

[L CC synth-2D]
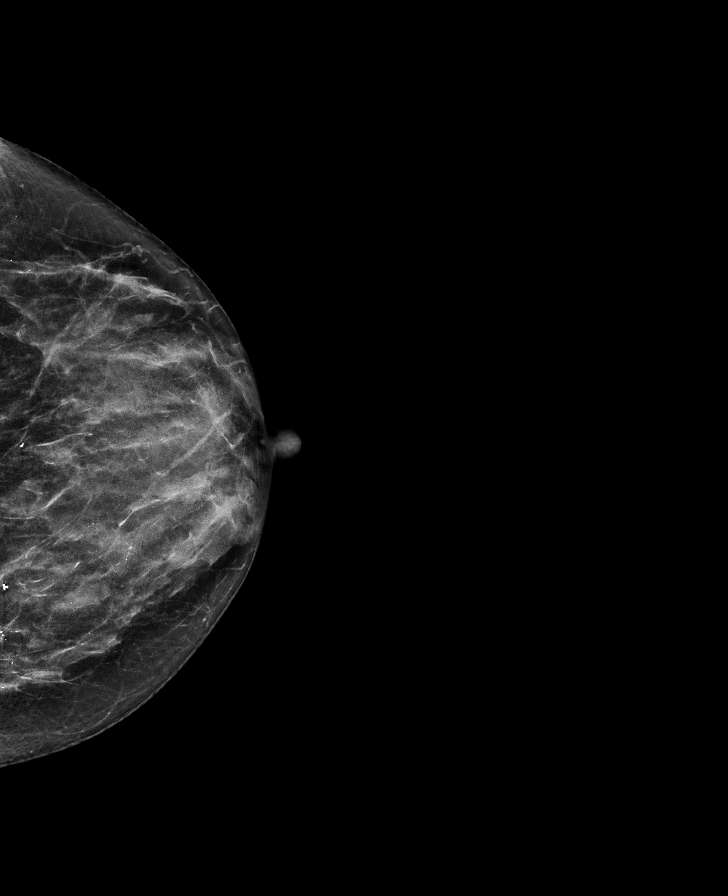

[R CC synth-2D]
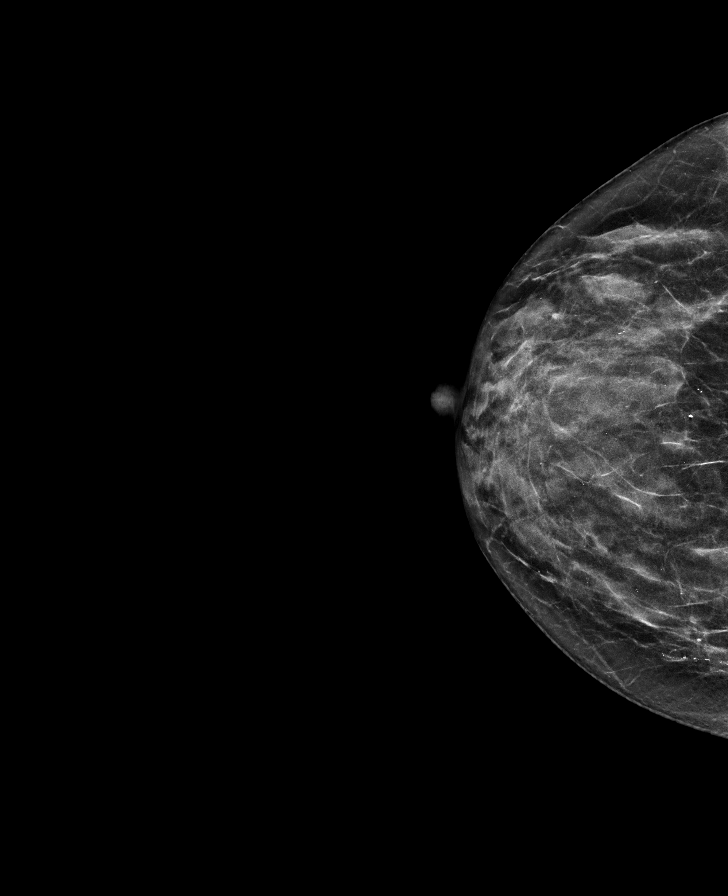

[L MLO tomo · tomo slice 35/69.0]
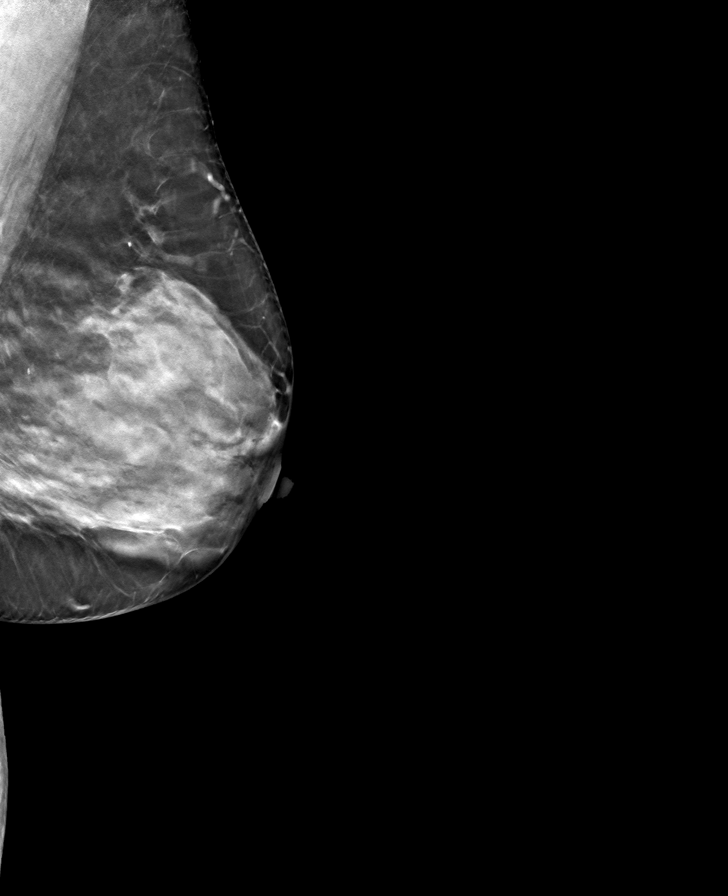

[L CC tomo · tomo slice 30/59.0]
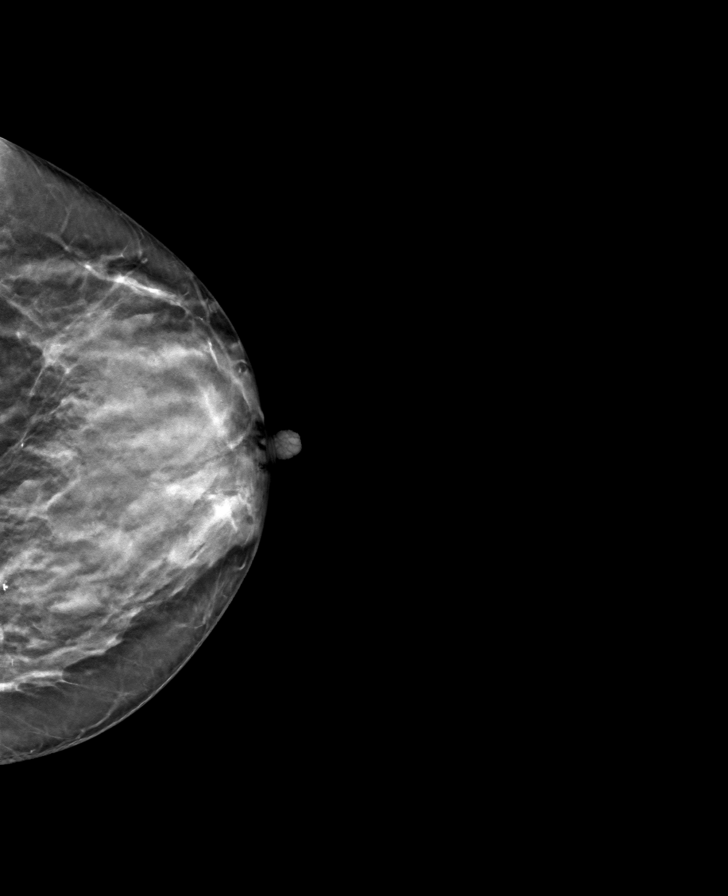

[R CC tomo · tomo slice 30/59.0]
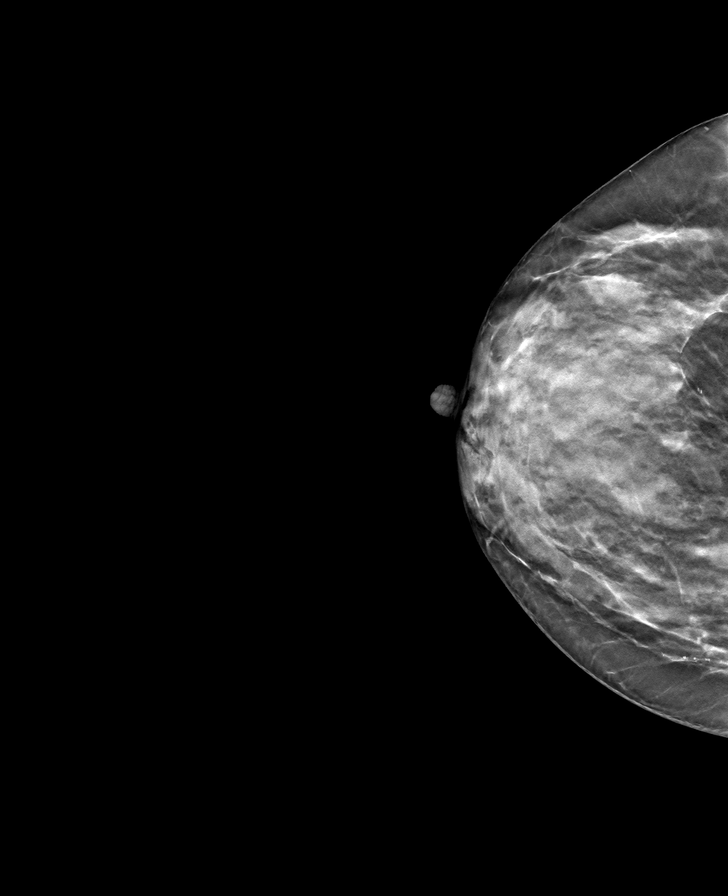

[R MLO tomo · tomo slice 35/68.0]
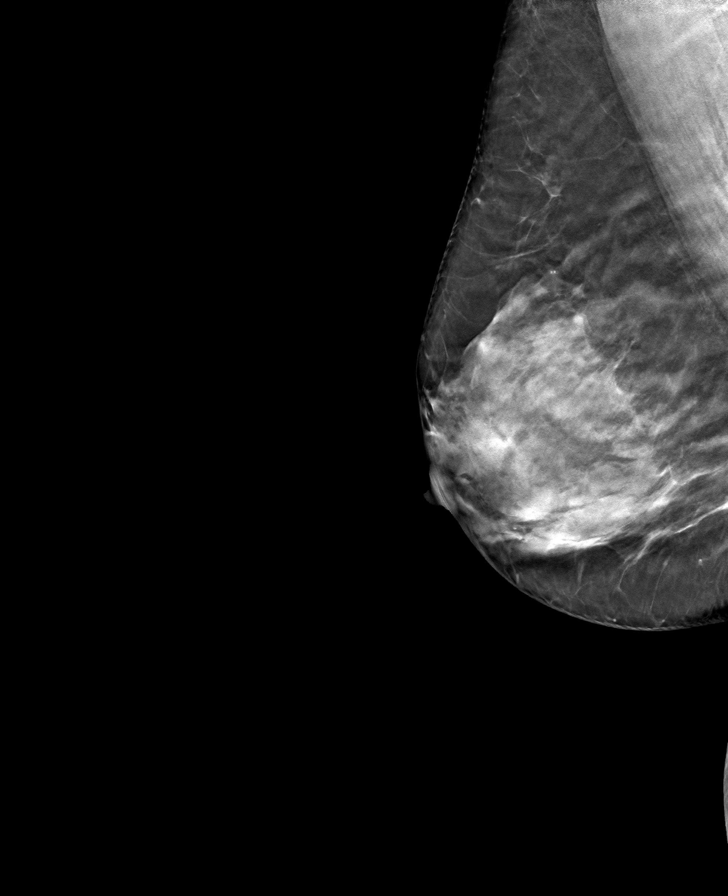

[8 of 24 positions shown; findings below may reference images not displayed]

ACR Breast Density Category d: The breast tissue is extremely dense,
which lowers the sensitivity of mammography
FINDINGS: There are no findings suspicious for malignancy.
IMPRESSION: No mammographic evidence of malignancy. A result letter of this
screening mammogram will be mailed directly to the patient.

RECOMMENDATION:
Screening mammogram in one year. (Code:TA-V-WV9)

BI-RADS CATEGORY  1: Negative.

## 2022-11-08 ENCOUNTER — Other Ambulatory Visit: Payer: Self-pay | Admitting: Nurse Practitioner

## 2022-11-08 ENCOUNTER — Encounter: Payer: Self-pay | Admitting: Family

## 2022-11-08 DIAGNOSIS — Z1231 Encounter for screening mammogram for malignant neoplasm of breast: Secondary | ICD-10-CM

## 2022-11-11 ENCOUNTER — Ambulatory Visit
Admission: RE | Admit: 2022-11-11 | Discharge: 2022-11-11 | Disposition: A | Discharge status: Home / Self Care | Payer: 59 | Source: Ambulatory Visit | Attending: Nurse Practitioner | Admitting: Nurse Practitioner

## 2022-11-11 DIAGNOSIS — Z1231 Encounter for screening mammogram for malignant neoplasm of breast: Secondary | ICD-10-CM

## 2022-11-12 ENCOUNTER — Other Ambulatory Visit: Payer: Self-pay | Admitting: Nurse Practitioner

## 2022-11-12 DIAGNOSIS — Z13820 Encounter for screening for osteoporosis: Secondary | ICD-10-CM

## 2023-03-05 ENCOUNTER — Ambulatory Visit (INDEPENDENT_AMBULATORY_CARE_PROVIDER_SITE_OTHER): Payer: 59

## 2023-03-05 ENCOUNTER — Ambulatory Visit
Admission: RE | Admit: 2023-03-05 | Discharge: 2023-03-05 | Disposition: A | Discharge status: Home / Self Care | Payer: 59 | Source: Ambulatory Visit | Attending: Internal Medicine | Admitting: Internal Medicine

## 2023-03-05 VITALS — BP 127/73 | HR 87 | Temp 97.3°F | Resp 16 | Ht 62.0 in | Wt 205.0 lb

## 2023-03-05 DIAGNOSIS — M79671 Pain in right foot: Secondary | ICD-10-CM

## 2023-03-05 NOTE — Discharge Instructions (Signed)
X-ray was normal.  Suspect foot/ankle sprain.  Ace wrap applied.  Do not sleep in this.  Elevate extremity and apply ice.

## 2023-03-05 NOTE — ED Provider Notes (Signed)
[facility]    CSN: [id number] Arrival date & time: 03/05/23  1340      History   Chief Complaint Chief Complaint  Patient presents with   Foot Pain    With swelling - Entered by patient    HPI Crystal Foster is a 72 y.o. female.   Patient presents with right foot pain that started about a week ago.  Denies numbness or tingling.  Patient reports bearing weight exacerbates pain.  Denies history of chronic pain in that foot.  Has not taken any medication for pain.  Reports that she twisted her foot over stepping off a curb recently.   Foot Pain    Past Medical History:  Diagnosis Date   Allergy    pollen   Anemia    IDA   Cataract    Hyperlipidemia    Hypertension    Migraine    Prediabetes     Patient Active Problem List   Diagnosis Date Noted   Hyperparathyroidism (HCC) 08/04/2020    Past Surgical History:  Procedure Laterality Date   COLONOSCOPY     COLONOSCOPY  2016   COLONOSCOPY  07/15/2020   DENTAL SURGERY  2021   STATES ALL TOP TEETH REMOVED   HYSTEROTOMY     PARATHYROIDECTOMY N/A 11/25/2020   Procedure: PARATHYROIDECTOMY;  Surgeon: Darnell Level, MD;  Location: [facility];  Service: General;  Laterality: N/A;   UPPER GASTROINTESTINAL ENDOSCOPY  07/15/2020    OB History   No obstetric history on file.      Home Medications    Prior to Admission medications   Medication Sig Start Date End Date Taking? Authorizing Provider  aspirin EC 81 MG tablet Take 81 mg by mouth daily. Swallow whole.   Yes [provider]  atorvastatin (LIPITOR) 10 MG tablet Take 10 mg by mouth every evening.   Yes [provider]  Cholecalciferol (DIALYVITE VITAMIN D 5000) 125 MCG (5000 UT) capsule Take 5,000 Units by mouth once a week.   Yes [provider]  docusate sodium (COLACE) 100 MG capsule Take 100 mg by mouth daily.   Yes [provider]  levocetirizine (XYZAL) 5 MG tablet Take 5 mg by mouth at bedtime. 04/07/20  Yes  [provider]  lisinopril (ZESTRIL) 20 MG tablet Take 1 tablet (20 mg total) by mouth daily. 08/04/20  Yes Romero Belling, MD  triamcinolone cream (KENALOG) 0.1 % Apply 1 application topically 2 (two) times daily as needed (rash). 03/27/20  Yes [provider]  ergocalciferol (VITAMIN D2) 1.25 MG (50000 UT) capsule Take 1 capsule (50,000 Units total) by mouth once a week. Monday and Friday Patient not taking: No sig reported 08/20/20   Romero Belling, MD  ferrous sulfate 325 (65 FE) MG tablet Take 325 mg by mouth daily with breakfast.    [provider]  traMADol (ULTRAM) 50 MG tablet Take 1-2 tablets (50-100 mg total) by mouth every 6 (six) hours as needed for moderate pain. 11/25/20   Darnell Level, MD    Family History Family History  Problem Relation Age of Onset   Breast cancer Mother    Heart disease Mother    Lung cancer Mother    Diabetes Mother    Glaucoma Mother    Cataracts Mother    Asthma Mother    Dementia Sister    Diabetes Sister    Fibromyalgia Sister    Cataracts Sister    Stomach cancer Maternal Aunt  Colon cancer Neg Hx    Rectal cancer Neg Hx    Pancreatic cancer Neg Hx    Colon polyps Neg Hx    Esophageal cancer Neg Hx    Hypercalcemia Neg Hx     Social History Social History   Tobacco Use   Smoking status: Never   Smokeless tobacco: Never  Vaping Use   Vaping Use: Never used  Substance Use Topics   Alcohol use: No   Drug use: No     Allergies   Hydrocodone   Review of Systems Review of Systems Per HPI  Physical Exam Triage Vital Signs ED Triage Vitals  Enc Vitals Group     BP 03/05/23 1418 127/73     Pulse Rate 03/05/23 1418 87     Resp 03/05/23 1418 16     Temp 03/05/23 1418 (!) 97.3 F (36.3 C)     Temp Source 03/05/23 1418 Oral     SpO2 03/05/23 1418 98 %     Weight 03/05/23 1420 205 lb (93 kg)     Height 03/05/23 1420 5\' 2"  (1.575 m)     Head Circumference --      Peak Flow --      Pain Score --       Pain Loc --      Pain Edu? --      Excl. in GC? --    No data found.  Updated Vital Signs BP 127/73 (BP Location: Left Arm)   Pulse 87   Temp (!) 97.3 F (36.3 C) (Oral)   Resp 16   Ht 5\' 2"  (1.575 m)   Wt 205 lb (93 kg)   SpO2 98%   BMI 37.49 kg/m   Visual Acuity Right Eye Distance:   Left Eye Distance:   Bilateral Distance:    Right Eye Near:   Left Eye Near:    Bilateral Near:     Physical Exam Constitutional:      General: She is not in acute distress.    Appearance: Normal appearance. She is not toxic-appearing or diaphoretic.  HENT:     Head: Normocephalic and atraumatic.  Eyes:     Extraocular Movements: Extraocular movements intact.     Conjunctiva/sclera: Conjunctivae normal.  Pulmonary:     Effort: Pulmonary effort is normal.  Musculoskeletal:       Feet:  Feet:     Comments: Patient has tenderness to palpation to dorsal surface of foot directly below ankle as well as to the plantar surface of the foot.  No obvious swelling or warmth noted.  No lacerations or abrasions.  Patient can wiggle toes.  Neurovascularly intact. No tenderness or swelling to ankle.  Neurological:     General: No focal deficit present.     Mental Status: She is alert and oriented to person, place, and time. Mental status is at baseline.  Psychiatric:        Mood and Affect: Mood normal.        Behavior: Behavior normal.        Thought Content: Thought content normal.        Judgment: Judgment normal.      UC Treatments / Results  Labs (all labs ordered are listed, but only abnormal results are displayed) Labs Reviewed - No data to display  EKG   Radiology DG Foot Complete Right  Result Date: 03/05/2023 CLINICAL DATA:  Right foot pain and swelling for 1 week. No known injury. EXAM: RIGHT FOOT  COMPLETE - 3+ VIEW COMPARISON:  None Available. FINDINGS: Soft tissue swelling seen in mid and hindfoot. No evidence of soft tissue gas or radiopaque foreign body. No evidence  of fracture or dislocation. Moderate hallux valgus with bony bunion noted. No other osseous abnormality identified. IMPRESSION: Soft tissue swelling. No acute osseous abnormality. Moderate hallux valgus with bony bunion. Electronically Signed   By: Danae Orleans M.D.   On: 03/05/2023 15:07    Procedures Procedures (including critical care time)  Medications Ordered in UC Medications - No data to display  Initial Impression / Assessment and Plan / UC Course  I have reviewed the triage vital signs and the nursing notes.  Pertinent labs & imaging results that were available during my care of the patient were reviewed by me and considered in my medical decision making (see chart for details).     Suspect some type of foot sprain given recent injury.  X-ray was negative for any acute bony abnormality.  Advised elevation, ice complication, Ace wrap.  Ace wrap applied in urgent care by clinical staff.  Advised patient not to sleep in this.  Advised follow-up with orthopedist at provided contact information if symptoms persist or worsen.  Patient and daughter verbalized understanding and were agreeable with plan. Final Clinical Impressions(s) / UC Diagnoses   Final diagnoses:  Right foot pain     Discharge Instructions      X-ray was normal.  Suspect foot/ankle sprain.  Ace wrap applied.  Do not sleep in this.  Elevate extremity and apply ice.     ED Prescriptions   None    PDMP not reviewed this encounter.   Gustavus Bryant, [place] 03/05/23 [phone]

## 2023-03-05 NOTE — ED Triage Notes (Signed)
Patient c/o that her right foot is swelling and painful x 1 week.  No apparent injury.  Worse when she's standing or applying pressure.  Denies any OTC pain meds.

## 2023-05-24 ENCOUNTER — Ambulatory Visit
Admission: RE | Admit: 2023-05-24 | Discharge: 2023-05-24 | Disposition: A | Discharge status: Home / Self Care | Payer: 59 | Source: Ambulatory Visit | Attending: Nurse Practitioner | Admitting: Nurse Practitioner

## 2023-05-24 DIAGNOSIS — Z13820 Encounter for screening for osteoporosis: Secondary | ICD-10-CM

## 2024-05-23 ENCOUNTER — Other Ambulatory Visit: Payer: Self-pay | Admitting: Nurse Practitioner

## 2024-05-23 DIAGNOSIS — Z1231 Encounter for screening mammogram for malignant neoplasm of breast: Secondary | ICD-10-CM

## 2024-06-18 ENCOUNTER — Ambulatory Visit
Admission: RE | Admit: 2024-06-18 | Discharge: 2024-06-18 | Disposition: A | Discharge status: Home / Self Care | Source: Ambulatory Visit | Attending: Nurse Practitioner | Admitting: Nurse Practitioner

## 2024-06-18 DIAGNOSIS — Z1231 Encounter for screening mammogram for malignant neoplasm of breast: Secondary | ICD-10-CM

## 2024-07-03 ENCOUNTER — Other Ambulatory Visit: Payer: Self-pay | Admitting: Nephrology

## 2024-07-03 DIAGNOSIS — N1832 Chronic kidney disease, stage 3b: Secondary | ICD-10-CM

## 2024-07-04 ENCOUNTER — Ambulatory Visit
Admission: RE | Admit: 2024-07-04 | Discharge: 2024-07-04 | Disposition: A | Discharge status: Home / Self Care | Source: Ambulatory Visit | Attending: Nephrology | Admitting: Nephrology

## 2024-07-04 DIAGNOSIS — N1832 Chronic kidney disease, stage 3b: Secondary | ICD-10-CM

## 2024-07-23 ENCOUNTER — Emergency Department (HOSPITAL_COMMUNITY)

## 2024-07-23 ENCOUNTER — Emergency Department (HOSPITAL_COMMUNITY)
Admission: EM | Admit: 2024-07-23 | Discharge: 2024-07-23 | Disposition: A | Discharge status: Home / Self Care | Attending: Emergency Medicine | Admitting: Emergency Medicine

## 2024-07-23 ENCOUNTER — Other Ambulatory Visit: Payer: Self-pay

## 2024-07-23 DIAGNOSIS — E041 Nontoxic single thyroid nodule: Secondary | ICD-10-CM | POA: Diagnosis not present

## 2024-07-23 DIAGNOSIS — I10 Essential (primary) hypertension: Secondary | ICD-10-CM | POA: Diagnosis not present

## 2024-07-23 DIAGNOSIS — S20214A Contusion of middle front wall of thorax, initial encounter: Secondary | ICD-10-CM | POA: Diagnosis not present

## 2024-07-23 DIAGNOSIS — S20219A Contusion of unspecified front wall of thorax, initial encounter: Secondary | ICD-10-CM

## 2024-07-23 DIAGNOSIS — Y92410 Unspecified street and highway as the place of occurrence of the external cause: Secondary | ICD-10-CM | POA: Diagnosis not present

## 2024-07-23 DIAGNOSIS — M542 Cervicalgia: Secondary | ICD-10-CM | POA: Diagnosis present

## 2024-07-23 DIAGNOSIS — S161XXA Strain of muscle, fascia and tendon at neck level, initial encounter: Secondary | ICD-10-CM | POA: Insufficient documentation

## 2024-07-23 MED ORDER — ACETAMINOPHEN 500 MG PO TABS
1000.0000 mg | ORAL_TABLET | Freq: Once | ORAL | Status: AC
  Administered 2024-07-23: 1000 mg via ORAL
  Filled 2024-07-23: qty 2
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
## 2024-07-31 ENCOUNTER — Other Ambulatory Visit: Payer: Self-pay | Admitting: Urology

## 2024-08-13 ENCOUNTER — Other Ambulatory Visit: Payer: Self-pay | Admitting: Surgery

## 2024-08-13 DIAGNOSIS — M79642 Pain in left hand: Secondary | ICD-10-CM

## 2024-08-13 DIAGNOSIS — S161XXA Strain of muscle, fascia and tendon at neck level, initial encounter: Secondary | ICD-10-CM

## 2024-08-13 DIAGNOSIS — M25561 Pain in right knee: Secondary | ICD-10-CM

## 2024-08-22 ENCOUNTER — Encounter (HOSPITAL_COMMUNITY): Payer: Self-pay | Admitting: Urology

## 2024-08-22 NOTE — Progress Notes (Signed)
 Spoke w/ via phone for pre-op interview--- Crystal Foster needs dos----   BMP per anesthesia.     Foster results------EKG in Epic dated 07/23/24. COVID test -----patient states asymptomatic no test needed Arrive at -------0930 NPO after MN NO Solid Food.  Clear liquids from MN until---0830 Pre-Surgery Ensure or G2:  Med rec completed Medications to take morning of surgery -----Norvasc. Diabetic medication -----  GLP1 agonist last dose: GLP1 instructions:  Patient instructed no nail polish to be worn day of surgery Patient instructed to bring photo id and insurance card day of surgery Patient aware to have Driver (ride ) / caregiver    for 24 hours after surgery - daughter Crystal Foster Patient Special Instructions ----- Pre-Op special Instructions -----  Patient verbalized understanding of instructions that were given at this phone interview. Patient denies chest pain, sob, fever, cough at the interview.

## 2024-08-23 NOTE — Progress Notes (Signed)
 Noted no documentation of patient being contacted with new start time for surgery 09-23-2024.  New started time 1030.  Called and spoke w/ patient she stated Dr Watt office called her and is aware to arrive at 0830.  Then she verbalized understanding to be npo after midnight with exception clear liquids until 0730.

## 2024-08-24 ENCOUNTER — Ambulatory Visit (HOSPITAL_COMMUNITY): Admitting: Anesthesiology

## 2024-08-24 ENCOUNTER — Ambulatory Visit (HOSPITAL_COMMUNITY)

## 2024-08-24 ENCOUNTER — Encounter (HOSPITAL_COMMUNITY): Admission: RE | Disposition: A | Payer: Self-pay | Source: Home / Self Care | Attending: Urology

## 2024-08-24 ENCOUNTER — Other Ambulatory Visit: Payer: Self-pay

## 2024-08-24 ENCOUNTER — Ambulatory Visit (HOSPITAL_COMMUNITY)
Admission: RE | Admit: 2024-08-24 | Discharge: 2024-08-24 | Disposition: A | Discharge status: Home / Self Care | Attending: Urology | Admitting: Urology

## 2024-08-24 ENCOUNTER — Encounter (HOSPITAL_COMMUNITY): Payer: Self-pay | Admitting: Urology

## 2024-08-24 DIAGNOSIS — N3592 Unspecified urethral stricture, female: Secondary | ICD-10-CM | POA: Insufficient documentation

## 2024-08-24 DIAGNOSIS — N132 Hydronephrosis with renal and ureteral calculous obstruction: Secondary | ICD-10-CM

## 2024-08-24 DIAGNOSIS — I10 Essential (primary) hypertension: Secondary | ICD-10-CM | POA: Insufficient documentation

## 2024-08-24 LAB — BASIC METABOLIC PANEL WITH GFR
Anion gap: 13 (ref 5–15)
BUN: 14 mg/dL (ref 8–23)
CO2: 23 mmol/L (ref 22–32)
Calcium: 9 mg/dL (ref 8.9–10.3)
Chloride: 103 mmol/L (ref 98–111)
Creatinine, Ser: 0.59 mg/dL (ref 0.44–1.00)
GFR, Estimated: >60 mL/min (ref >60)
Glucose, Bld: 85 mg/dL (ref 70–99)
Potassium: 3.4 mmol/L — ABNORMAL LOW (ref 3.5–5.1)
Sodium: 139 mmol/L (ref 135–145)

## 2024-08-24 SURGERY — CYSTOSCOPY/URETEROSCOPY/HOLMIUM LASER/STENT PLACEMENT
Anesthesia: General | Laterality: Right

## 2024-08-24 MED ORDER — LIDOCAINE 2% (20 MG/ML) 5 ML SYRINGE
INTRAMUSCULAR | Status: AC
  Filled 2024-08-24: qty 5

## 2024-08-24 MED ORDER — ACETAMINOPHEN 325 MG PO TABS: 650.0000 mg | ORAL_TABLET | ORAL | Status: DC | PRN

## 2024-08-24 MED ORDER — CEFAZOLIN SODIUM-DEXTROSE 2-4 GM/100ML-% IV SOLN
INTRAVENOUS | Status: AC
Start: 2024-08-24 — End: 2024-08-24
  Filled 2024-08-24: qty 100

## 2024-08-24 MED ORDER — PHENYLEPHRINE 80 MCG/ML (10ML) SYRINGE FOR IV PUSH (FOR BLOOD PRESSURE SUPPORT)
PREFILLED_SYRINGE | INTRAVENOUS | Status: AC
  Filled 2024-08-24: qty 10

## 2024-08-24 MED ORDER — TRAMADOL HCL 50 MG PO TABS: 50.0000 mg | ORAL_TABLET | Freq: Four times a day (QID) | ORAL | 0 refills | Status: AC | PRN

## 2024-08-24 MED ORDER — ONDANSETRON HCL 4 MG/2ML IJ SOLN
INTRAMUSCULAR | Status: DC | PRN
  Administered 2024-08-24: 4 mg via INTRAVENOUS

## 2024-08-24 MED ORDER — CHLORHEXIDINE GLUCONATE 0.12 % MT SOLN
OROMUCOSAL | Status: AC
  Filled 2024-08-24: qty 15

## 2024-08-24 MED ORDER — FENTANYL CITRATE (PF) 100 MCG/2ML IJ SOLN: 25.0000 ug | INTRAMUSCULAR | Status: DC | PRN

## 2024-08-24 MED ORDER — LACTATED RINGERS IV SOLN
INTRAVENOUS | Status: DC
Start: 2024-08-24 — End: 2024-08-24

## 2024-08-24 MED ORDER — IOHEXOL 300 MG/ML  SOLN
INTRAMUSCULAR | Status: DC | PRN
  Administered 2024-08-24: 10 mL via URETHRAL

## 2024-08-24 MED ORDER — SODIUM CHLORIDE 0.9 % IR SOLN
Status: DC | PRN
  Administered 2024-08-24 (×2): 3000 mL via INTRAVESICAL

## 2024-08-24 MED ORDER — ACETAMINOPHEN 650 MG RE SUPP: 650.0000 mg | RECTAL | Status: DC | PRN

## 2024-08-24 MED ORDER — OXYCODONE HCL 5 MG PO TABS: 5.0000 mg | ORAL_TABLET | ORAL | Status: DC | PRN

## 2024-08-24 MED ORDER — FENTANYL CITRATE (PF) 250 MCG/5ML IJ SOLN
INTRAMUSCULAR | Status: DC | PRN
  Administered 2024-08-24 (×3): 25 ug via INTRAVENOUS

## 2024-08-24 MED ORDER — CEPHALEXIN 500 MG PO CAPS: 500.0000 mg | ORAL_CAPSULE | Freq: Two times a day (BID) | ORAL | 0 refills | Status: AC

## 2024-08-24 MED ORDER — OXYCODONE HCL 5 MG PO TABS: 5.0000 mg | ORAL_TABLET | Freq: Once | ORAL | Status: DC | PRN

## 2024-08-24 MED ORDER — FENTANYL CITRATE (PF) 100 MCG/2ML IJ SOLN
INTRAMUSCULAR | Status: AC
  Filled 2024-08-24: qty 2

## 2024-08-24 MED ORDER — LIDOCAINE HCL URETHRAL/MUCOSAL 2 % EX GEL
CUTANEOUS | Status: AC
  Filled 2024-08-24: qty 11

## 2024-08-24 MED ORDER — ONDANSETRON HCL 4 MG/2ML IJ SOLN
INTRAMUSCULAR | Status: AC
  Filled 2024-08-24: qty 2

## 2024-08-24 MED ORDER — DEXAMETHASONE SOD PHOSPHATE PF 10 MG/ML IJ SOLN
INTRAMUSCULAR | Status: DC | PRN
  Administered 2024-08-24: 10 mg via INTRAVENOUS

## 2024-08-24 MED ORDER — SODIUM CHLORIDE 0.9 % IV SOLN
250.0000 mL | INTRAVENOUS | Status: DC | PRN
Start: 2024-08-24 — End: 2024-08-24

## 2024-08-24 MED ORDER — PHENYLEPHRINE 80 MCG/ML (10ML) SYRINGE FOR IV PUSH (FOR BLOOD PRESSURE SUPPORT)
PREFILLED_SYRINGE | INTRAVENOUS | Status: DC | PRN
  Administered 2024-08-24 (×4): 80 ug via INTRAVENOUS

## 2024-08-24 MED ORDER — PROPOFOL 10 MG/ML IV BOLUS
INTRAVENOUS | Status: DC | PRN
  Administered 2024-08-24: 150 mg via INTRAVENOUS
  Administered 2024-08-24: 50 mg via INTRAVENOUS

## 2024-08-24 MED ORDER — ONDANSETRON HCL 4 MG/2ML IJ SOLN: 4.0000 mg | Freq: Four times a day (QID) | INTRAMUSCULAR | Status: DC | PRN

## 2024-08-24 MED ORDER — OXYCODONE HCL 5 MG/5ML PO SOLN: 5.0000 mg | Freq: Once | ORAL | Status: DC | PRN

## 2024-08-24 MED ORDER — PROPOFOL 10 MG/ML IV BOLUS
INTRAVENOUS | Status: AC
  Filled 2024-08-24: qty 20

## 2024-08-24 MED ORDER — SODIUM CHLORIDE 0.9% FLUSH: 3.0000 mL | Freq: Two times a day (BID) | INTRAVENOUS | Status: DC

## 2024-08-24 MED ORDER — SODIUM CHLORIDE 0.9% FLUSH: 3.0000 mL | INTRAVENOUS | Status: DC | PRN

## 2024-08-24 MED ORDER — CHLORHEXIDINE GLUCONATE 0.12 % MT SOLN
15.0000 mL | Freq: Once | OROMUCOSAL | Status: AC
  Administered 2024-08-24: 15 mL via OROMUCOSAL

## 2024-08-24 MED ORDER — ORAL CARE MOUTH RINSE: 15.0000 mL | Freq: Once | OROMUCOSAL | Status: AC

## 2024-08-24 MED ORDER — LIDOCAINE 2% (20 MG/ML) 5 ML SYRINGE
INTRAMUSCULAR | Status: DC | PRN
  Administered 2024-08-24: 60 mg via INTRAVENOUS

## 2024-08-24 MED ORDER — CEFAZOLIN SODIUM-DEXTROSE 2-4 GM/100ML-% IV SOLN
2.0000 g | INTRAVENOUS | Status: AC
  Administered 2024-08-24: 2 g via INTRAVENOUS

## 2024-08-24 MED ORDER — TAMSULOSIN HCL 0.4 MG PO CAPS
0.4000 mg | ORAL_CAPSULE | Freq: Every day | ORAL | 1 refills | Status: AC
Start: 2024-08-24 — End: ?

## 2024-08-24 SURGICAL SUPPLY — 25 items
BAG DRAIN URO-CYSTO SKYTR STRL (DRAIN) ×1 IMPLANT
BASKET ZERO TIP NITINOL 2.4FR (BASKET) IMPLANT
CATH ROBINSON RED A/P 14FR (CATHETERS) IMPLANT
CATH URETL OPEN 5X70 (CATHETERS) IMPLANT
COVER DOME SNAP 22 D (MISCELLANEOUS) IMPLANT
ELECTRODE REM PT RTRN 9FT ADLT (ELECTROSURGICAL) IMPLANT
EXTRACTOR STONE 1.7FRX115CM (UROLOGICAL SUPPLIES) IMPLANT
EXTRACTOR STONE NITINOL NGAGE (UROLOGICAL SUPPLIES) IMPLANT
FIBER LASER TRAC TIP (UROLOGICAL SUPPLIES) IMPLANT
GLOVE SURG SS PI 8.0 STRL IVOR (GLOVE) ×1 IMPLANT
GOWN STRL SURGICAL XL XLNG (GOWN DISPOSABLE) ×1 IMPLANT
GUIDEWIRE ANG ZIPWIRE 035X150 (WIRE) IMPLANT
GUIDEWIRE ANG ZIPWIRE 038X150 (WIRE) IMPLANT
GUIDEWIRE STR DUAL SENSOR (WIRE) ×1 IMPLANT
LASER FIB FLEXIVA PULSE ID 365 (Laser) IMPLANT
MANIFOLD NEPTUNE II (INSTRUMENTS) ×1 IMPLANT
PACK CYSTO (CUSTOM PROCEDURE TRAY) ×1 IMPLANT
SHEATH NAVIGATOR HD 11/13X28 (SHEATH) IMPLANT
SHEATH NAVIGATOR HD 11/13X36 (SHEATH) IMPLANT
SHEATH NAVIGATOR HD 12/14X46 (SHEATH) IMPLANT
SLEEVE SCD COMPRESS KNEE MED (STOCKING) ×1 IMPLANT
SOL .9 NS 3000ML IRR UROMATIC (IV SOLUTION) ×2 IMPLANT
STENT URET 6FRX22 CONTOUR (STENTS) IMPLANT
TUBE CONNECTING 12X1/4 (SUCTIONS) ×2 IMPLANT
TUBING UROLOGY SET (TUBING) IMPLANT

## 2024-08-24 NOTE — Anesthesia Preprocedure Evaluation (Signed)
 Anesthesia Evaluation  Patient identified by MRN, date of birth, ID band Patient awake    Reviewed: Allergy & Precautions, H&P , NPO status , Patient's Chart, lab work & pertinent test results  Airway Mallampati: II   Neck ROM: full    Dental   Pulmonary neg pulmonary ROS   breath sounds clear to auscultation       Cardiovascular hypertension,  Rhythm:regular Rate:Normal     Neuro/Psych  Headaches    GI/Hepatic   Endo/Other    Renal/GU stones     Musculoskeletal   Abdominal   Peds  Hematology   Anesthesia Other Findings   Reproductive/Obstetrics                              Anesthesia Physical Anesthesia Plan  ASA: 2  Anesthesia Plan: General   Post-op Pain Management:    Induction: Intravenous  PONV Risk Score and Plan: 3 and Ondansetron , Dexamethasone  and Treatment may vary due to age or medical condition  Airway Management Planned: LMA  Additional Equipment:   Intra-op Plan:   Post-operative Plan: Extubation in OR  Informed Consent: I have reviewed the patients History and Physical, chart, labs and discussed the procedure including the risks, benefits and alternatives for the proposed anesthesia with the patient or authorized representative who has indicated his/her understanding and acceptance.     Dental advisory given  Plan Discussed with: CRNA, Anesthesiologist and Surgeon  Anesthesia Plan Comments:         Anesthesia Quick Evaluation

## 2024-08-24 NOTE — Discharge Instructions (Addendum)
  Post Anesthesia Home Care Instructions  Activity: Get plenty of rest for the remainder of the day. A responsible individual must stay with you for 24 hours following the procedure.  For the next 24 hours, DO NOT: -Drive a car -Advertising copywriter -Drink alcoholic beverages -Take any medication unless instructed by your physician -Make any legal decisions or sign important papers.  Meals: Start with liquid foods such as gelatin or soup. Progress to regular foods as tolerated. Avoid greasy, spicy, heavy foods. If nausea and/or vomiting occur, drink only clear liquids until the nausea and/or vomiting subsides. Call your physician if vomiting continues.  Special Instructions/Symptoms: Your throat may feel dry or sore from the anesthesia or the breathing tube placed in your throat during surgery. If this causes discomfort, gargle with warm salt water. The discomfort should disappear within 24 hours.      Your stones were sent to the lab for analysis.  I have sent scripts to the pharmacy for an antibiotic (cephalexin ), pain med (tramadol ) and a medicine to help tolerate the stent and pass fragments (tamsulosin )   The last can cause some nasal congestion and dizziness with standing in some people so just be careful standing up once you start the med. SABRA

## 2024-08-24 NOTE — Op Note (Signed)
 Procedure: 1.  Cystoscopy with urethral dilation. 2.  Cystoscopy with right retrograde pyelogram and interpretation. 3.  Right ureteroscopy with holmium laser application, stone extraction and placement of right double-J stent. 4.  Application of fluoroscopy.  Pre-op diagnosis: 14 mm right UPJ stone with obstruction.  Postop diagnosis: Same.  Surgeon: Dr. Norleen Seltzer.  Anesthesia: General.  Specimen: Stone fragments.  Drains: 6 French by 22 cm right Contour double-J stent without tether.  EBL: None.  Complications: None.  Indications: The patient is a 73 year old female who was found to have a 14 mm right UPJ stone with obstruction.  She has been minimally symptomatic but in retrospect on review of imagings the stone has been in place at least 5 years and there is significant renal atrophy.  It was felt that ureteroscopy and stone removal was indicated with assessment of renal function once the stone had been removed.  Procedure: She was taken the operating room where general anesthetic was induced.  Ancef  was given.  She was placed in lithotomy position and food PSIs.  Her perineum and genitalia were prepped Betadine solution she was draped in usual sterile fashion.  Initial attempt at passage of the cystoscope was unsuccessful due to meatal stenosis.  She was dilated to 72 French with female sounds.  cystoscopy was then performed using the 21 French scope and 30 degree lens.  Examination revealed a normal urethra.  The bladder wall was smooth and pale without tumors, stones or inflammation.  Ureteral orifices were unremarkable.  The right ureteral orifice was cannulated with a 5 French open-ended catheter and Omnipaque  was instilled.  The right retrograde pyelogram demonstrated a normal caliber ureter up to a filling defect at the level of the right UPJ consistent with the stone which was easy to see on fluoroscopy.  Initially there was no contrast noted beyond the stone.  I then  advanced a sensor wire through the open-ended catheter but it coiled at the level of the stone and it would not go into the collecting system.  The inner core of a 11/13 French by 36 cm digital access sheath was then passed over the wire and easily advanced to just below the stone.  This was followed by the assembled sheath which was also easily passed.  The dual-lumen 6-1/2 French semirigid ureteroscope was then passed alongside the wire and the stone was visualized at the UPJ.  I then passed a zip wire through the ureteroscope and was able to advance it into the collecting system and once it had gone into the collecting system the sensor wire and coiled in followed into the collecting system.  I then exchanged the zip wire for the 242 m tract laser fiber with the laser set on 0.3 J and 53 Hz on the left pedal and 1 J and 20 Hz on the right pedal.  The stone was then fragmented using a combination of the settings and fragments were sequentially flushed out and removed with an engage basket as the lasering progressed.  I did put a 14 French red rubber catheter in the bladder to decompress the bladder during the treatment because of the size of the stone.  Eventually I reached the point where the stone was adherent to the posterior UPJ and I could not get an appropriate angle with the semirigid scope to complete the fragmentation.  I then passed another sensor wire through the ureteroscope and remove the ureteroscope and then passed the assembled access sheath over that wire into  the proximal ureter.  There was a little more resistance with the safety wire in place so I did not get it in close proximity to the stone.  Once the sheath was position the inner core and working wire were removed.  I then passed the dual-lumen digital flexible scope and fitted it with the laser fiber and engage basket.  The stone was then further fragmented and areas where it was adherent to the mucosa from such prolonged  impaction were teased away.  The stone then dropped into the renal pelvis where additional fragmentation was performed.  The engage basket was then used to remove any residual significant fragments.  Of note there were a few small round smooth stones in the mid pole calyx and several were removed but they were only 2 to 3 mm in size and it was felt that the remaining would pass once the stent had been removed.  Once the stone had been adequately fragmented, the ureteroscope was removed and the sheath was very gently removed as well.  I then passed the cystoscope and evacuated the stone fragments that were present from the initial ureteroscopy and then repassed the cystoscope over the wire and passed a 6 French by 22 cm contour double-J stent under fluoroscopic guidance.  The wire was removed, leaving a good coil in the kidney and a good coil in the bladder.  The bladder was then drained.  She was taken down from lithotomy position, her anesthetic was reversed and she was moved to recovery in stable condition.  The stone fragments were sent to the lab for analysis.  There were no complications.

## 2024-08-24 NOTE — Anesthesia Postprocedure Evaluation (Signed)
 Anesthesia Post Note  Patient: Crystal Foster  Procedure(s) Performed: CYSTOSCOPY/URETEROSCOPY/HOLMIUM LASER/STENT PLACEMENT (Right) CYSTOSCOPY, WITH RETROGRADE PYELOGRAM (Right)     Patient location during evaluation: PACU Anesthesia Type: General Level of consciousness: awake and alert Pain management: pain level controlled Vital Signs Assessment: post-procedure vital signs reviewed and stable Respiratory status: spontaneous breathing, nonlabored ventilation, respiratory function stable and patient connected to nasal cannula oxygen Cardiovascular status: blood pressure returned to baseline and stable Postop Assessment: no apparent nausea or vomiting Anesthetic complications: no   No notable events documented.  Last Vitals:  Vitals:   08/24/24 1215 08/24/24 1245  BP:  (!) 141/62  Pulse:  64  Resp:  18  Temp: 36.5 C 36.5 C  SpO2:  100%    Last Pain:  Vitals:   08/24/24 1230  TempSrc:   PainSc: 0-No pain                 Cait Locust S

## 2024-08-24 NOTE — Anesthesia Procedure Notes (Signed)
 Procedure Name: LMA Insertion Date/Time: 08/24/2024 10:10 AM  Performed by: Loreli Blima LABOR, CRNAPre-anesthesia Checklist: Patient identified, Emergency Drugs available, Suction available and Patient being monitored Patient Re-evaluated:Patient Re-evaluated prior to induction Oxygen Delivery Method: Circle System Utilized Preoxygenation: Pre-oxygenation with 100% oxygen Induction Type: IV induction Ventilation: Mask ventilation without difficulty LMA: LMA inserted LMA Size: 4.0 Number of attempts: 1 Placement Confirmation: positive ETCO2 Tube secured with: Tape Dental Injury: Teeth and Oropharynx as per pre-operative assessment

## 2024-08-24 NOTE — Interval H&P Note (Signed)
 History and Physical Interval Note:  No changes in H&P.  She has no new questions.   08/24/2024 8:54 AM  Crystal Foster  has presented today for surgery, with the diagnosis of HYDRONEPHROSIS WITH RENAL AND URETERAL STONE OBSTRUCTION.  The various methods of treatment have been discussed with the patient and family. After consideration of risks, benefits and other options for treatment, the patient has consented to  Procedure(s): CYSTOSCOPY/URETEROSCOPY/HOLMIUM LASER/STENT PLACEMENT (Right) CYSTOSCOPY, WITH RETROGRADE PYELOGRAM (Right) as a surgical intervention.  The patient's history has been reviewed, patient examined, no change in status, stable for surgery.  I have reviewed the patient's chart and labs.  Questions were answered to the patient's satisfaction.     Eliot Popper

## 2024-08-24 NOTE — Transfer of Care (Signed)
 Immediate Anesthesia Transfer of Care Note  Patient: Crystal Foster  Procedure(s) Performed: CYSTOSCOPY/URETEROSCOPY/HOLMIUM LASER/STENT PLACEMENT (Right) CYSTOSCOPY, WITH RETROGRADE PYELOGRAM (Right)  Patient Location: PACU  Anesthesia Type:General  Level of Consciousness: awake  Airway & Oxygen Therapy: Patient Spontanous Breathing  Post-op Assessment: Report given to RN and Post -op Vital signs reviewed and stable  Post vital signs: Reviewed and stable  Last Vitals:  Vitals Value Taken Time  BP 127/68 08/24/24 11:41  Temp    Pulse 70 08/24/24 11:43  Resp 26 08/24/24 11:43  SpO2 94 % 08/24/24 11:43  Vitals shown include unfiled device data.  Last Pain:  Vitals:   08/24/24 0846  TempSrc: Oral  PainSc: 0-No pain      Patients Stated Pain Goal: 5 (08/24/24 0846)  Complications: No notable events documented.

## 2024-08-27 ENCOUNTER — Encounter (HOSPITAL_COMMUNITY): Payer: Self-pay | Admitting: Urology

## 2024-09-01 ENCOUNTER — Ambulatory Visit
Admission: RE | Admit: 2024-09-01 | Discharge: 2024-09-01 | Disposition: A | Discharge status: Home / Self Care | Source: Ambulatory Visit | Attending: Surgery | Admitting: Surgery

## 2024-09-01 DIAGNOSIS — M79642 Pain in left hand: Secondary | ICD-10-CM

## 2024-09-01 DIAGNOSIS — S161XXA Strain of muscle, fascia and tendon at neck level, initial encounter: Secondary | ICD-10-CM

## 2024-09-01 DIAGNOSIS — M25561 Pain in right knee: Secondary | ICD-10-CM

## 2024-09-06 LAB — MISC LABCORP TEST (SEND OUT): Labcorp test code: 910180

## 2024-09-16 ENCOUNTER — Other Ambulatory Visit: Payer: Self-pay | Admitting: Urology

## 2024-10-17 ENCOUNTER — Other Ambulatory Visit (HOSPITAL_COMMUNITY): Payer: Self-pay | Admitting: Adult Health

## 2024-10-17 DIAGNOSIS — N1330 Unspecified hydronephrosis: Secondary | ICD-10-CM

## 2024-10-23 ENCOUNTER — Ambulatory Visit (HOSPITAL_COMMUNITY)
Admission: RE | Admit: 2024-10-23 | Discharge: 2024-10-23 | Disposition: A | Discharge status: Home / Self Care | Source: Ambulatory Visit | Attending: Adult Health | Admitting: Adult Health

## 2024-10-23 DIAGNOSIS — N1330 Unspecified hydronephrosis: Secondary | ICD-10-CM | POA: Insufficient documentation

## 2024-10-23 MED ORDER — FUROSEMIDE 10 MG/ML IJ SOLN
47.0000 mg | Freq: Once | INTRAMUSCULAR | Status: AC
  Administered 2024-10-23: 47 mg via INTRAVENOUS

## 2024-10-23 MED ORDER — FUROSEMIDE 10 MG/ML IJ SOLN
INTRAMUSCULAR | Status: AC
  Filled 2024-10-23: qty 8

## 2024-10-23 MED ORDER — TECHNETIUM TC 99M MERTIATIDE
5.0000 | Freq: Once | INTRAVENOUS | Status: AC | PRN
  Administered 2024-10-23: 5 via INTRAVENOUS

## 2024-11-05 ENCOUNTER — Emergency Department (HOSPITAL_COMMUNITY)
Admission: EM | Admit: 2024-11-05 | Discharge: 2024-11-05 | Disposition: A | Discharge status: Home / Self Care | Attending: Emergency Medicine | Admitting: Emergency Medicine

## 2024-11-05 DIAGNOSIS — G43809 Other migraine, not intractable, without status migrainosus: Secondary | ICD-10-CM | POA: Insufficient documentation

## 2024-11-05 MED ORDER — KETOROLAC TROMETHAMINE 15 MG/ML IJ SOLN
15.0000 mg | Freq: Once | INTRAMUSCULAR | Status: AC
  Administered 2024-11-05: 15 mg via INTRAVENOUS
  Filled 2024-11-05: qty 1

## 2024-11-05 MED ORDER — PROCHLORPERAZINE EDISYLATE 10 MG/2ML IJ SOLN
10.0000 mg | Freq: Once | INTRAMUSCULAR | Status: AC
  Administered 2024-11-05: 10 mg via INTRAVENOUS
  Filled 2024-11-05: qty 2

## 2024-11-05 NOTE — ED Notes (Signed)
 Pt with a pulsating and sharp HA on the L side with photo/phonophobia and nausea. She did not have any changes to her vision at onset. It was a gradual onset starting 8 days ago and was worse after she had cataract surgery. After her cataract surgery she has had some improvement in her vision, but she has vomited x3 in the last 24 hours. She has some associated lightheaded feeling. Denies any room spinning or off balance sensation.

## 2024-11-05 NOTE — ED Triage Notes (Signed)
 Patient complains of Migraine x 8 days. Hx of Migraines. Migraine meds are not effective. Had cataract surgery 4 days ago. Rates pain 10/10. Patient complains of being dizzy and vomiting.

## 2024-11-06 ENCOUNTER — Emergency Department (HOSPITAL_COMMUNITY)

## 2024-11-06 ENCOUNTER — Emergency Department (HOSPITAL_COMMUNITY): Admission: EM | Admit: 2024-11-06 | Discharge: 2024-11-06 | Disposition: A | Discharge status: Home / Self Care

## 2024-11-06 ENCOUNTER — Encounter (HOSPITAL_COMMUNITY): Payer: Self-pay

## 2024-11-06 ENCOUNTER — Other Ambulatory Visit: Payer: Self-pay

## 2024-11-06 DIAGNOSIS — R519 Headache, unspecified: Secondary | ICD-10-CM | POA: Insufficient documentation

## 2024-11-06 DIAGNOSIS — I10 Essential (primary) hypertension: Secondary | ICD-10-CM | POA: Insufficient documentation

## 2024-11-06 DIAGNOSIS — G9389 Other specified disorders of brain: Secondary | ICD-10-CM

## 2024-11-06 DIAGNOSIS — M5481 Occipital neuralgia: Secondary | ICD-10-CM

## 2024-11-06 DIAGNOSIS — Z79899 Other long term (current) drug therapy: Secondary | ICD-10-CM | POA: Insufficient documentation

## 2024-11-06 DIAGNOSIS — R42 Dizziness and giddiness: Secondary | ICD-10-CM | POA: Insufficient documentation

## 2024-11-06 DIAGNOSIS — Z7982 Long term (current) use of aspirin: Secondary | ICD-10-CM | POA: Insufficient documentation

## 2024-11-06 LAB — CBC
HCT: 34.9 % — ABNORMAL LOW (ref 36.0–46.0)
Hemoglobin: 10.7 g/dL — ABNORMAL LOW (ref 12.0–15.0)
MCH: 27.1 pg (ref 26.0–34.0)
MCHC: 30.7 g/dL (ref 30.0–36.0)
MCV: 88.4 fL (ref 80.0–100.0)
Platelets: 274 10*3/uL (ref 150–400)
RBC: 3.95 MIL/uL (ref 3.87–5.11)
RDW: 17.2 % — ABNORMAL HIGH (ref 11.5–15.5)
WBC: 8.5 10*3/uL (ref 4.0–10.5)
nRBC: 0 % (ref 0.0–0.2)

## 2024-11-06 LAB — BASIC METABOLIC PANEL WITH GFR
Anion gap: 10 (ref 5–15)
BUN: 26 mg/dL — ABNORMAL HIGH (ref 8–23)
CO2: 23 mmol/L (ref 22–32)
Calcium: 9.8 mg/dL (ref 8.9–10.3)
Chloride: 108 mmol/L (ref 98–111)
Creatinine, Ser: 1.65 mg/dL — ABNORMAL HIGH (ref 0.44–1.00)
GFR, Estimated: 33 mL/min — ABNORMAL LOW
Glucose, Bld: 92 mg/dL (ref 70–99)
Potassium: 3.9 mmol/L (ref 3.5–5.1)
Sodium: 141 mmol/L (ref 135–145)

## 2024-11-06 LAB — HEPATIC FUNCTION PANEL
ALT: 16 U/L (ref 0–44)
AST: 23 U/L (ref 15–41)
Albumin: 3.9 g/dL (ref 3.5–5.0)
Alkaline Phosphatase: 126 U/L (ref 38–126)
Bilirubin, Direct: 0.2 mg/dL (ref 0.0–0.2)
Indirect Bilirubin: 0.3 mg/dL (ref 0.3–0.9)
Total Bilirubin: 0.5 mg/dL (ref 0.0–1.2)
Total Protein: 7.9 g/dL (ref 6.5–8.1)

## 2024-11-06 LAB — LIPASE, BLOOD: Lipase: 48 U/L (ref 11–51)

## 2024-11-06 MED ORDER — BUPIVACAINE HCL (PF) 0.5 % IJ SOLN
10.0000 mL | Freq: Once | INTRAMUSCULAR | Status: AC
  Administered 2024-11-06: 10 mL
  Filled 2024-11-06: qty 30

## 2024-11-06 MED ORDER — KETOROLAC TROMETHAMINE 30 MG/ML IJ SOLN
15.0000 mg | Freq: Once | INTRAMUSCULAR | Status: AC
  Administered 2024-11-06: 15 mg via INTRAVENOUS
  Filled 2024-11-06: qty 1

## 2024-11-06 MED ORDER — DIAZEPAM 5 MG/ML IJ SOLN
2.5000 mg | Freq: Once | INTRAMUSCULAR | Status: AC | PRN
  Administered 2024-11-06: 2.5 mg via INTRAVENOUS
  Filled 2024-11-06: qty 2

## 2024-11-06 MED ORDER — METHYLPREDNISOLONE SODIUM SUCC 40 MG IJ SOLR
40.0000 mg | Freq: Once | INTRAMUSCULAR | Status: AC
  Administered 2024-11-06: 40 mg via INTRAMUSCULAR
  Filled 2024-11-06: qty 1

## 2024-11-06 MED ORDER — PROCHLORPERAZINE EDISYLATE 10 MG/2ML IJ SOLN
10.0000 mg | Freq: Once | INTRAMUSCULAR | Status: AC
  Administered 2024-11-06: 10 mg via INTRAVENOUS
  Filled 2024-11-06: qty 2

## 2024-11-06 MED ORDER — DIPHENHYDRAMINE HCL 50 MG/ML IJ SOLN
12.5000 mg | Freq: Once | INTRAMUSCULAR | Status: AC
  Administered 2024-11-06: 12.5 mg via INTRAVENOUS
  Filled 2024-11-06: qty 1

## 2024-11-06 MED ORDER — SODIUM CHLORIDE 0.9 % IV BOLUS
1000.0000 mL | Freq: Once | INTRAVENOUS | Status: AC
  Administered 2024-11-06: 1000 mL via INTRAVENOUS

## 2024-11-06 MED ORDER — BUPIVACAINE HCL 0.5 % IJ SOLN
3.0000 mL | INTRAMUSCULAR | Status: DC
  Administered 2024-11-06: 3 mL

## 2024-11-06 NOTE — Discharge Instructions (Signed)
 ### Emergency Department Visit Summary and Follow-Up Instructions     **Your Emergency Department Visit**      You came to the emergency department today with a severe left-sided headache and new dizziness. We treated you with several medications commonly used for migraines (Compazine , Benadryl , Toradol , and Valium ), but your headache did not improve with these treatments.      **What Worked**      We then performed a nerve block procedure, injecting numbing medicine (bupivacaine ) and a steroid (methylprednisolone ) around the nerves at the back of your head on the left side (called the greater and lesser occipital nerves). This completely relieved your headache and dizziness, which tells us  you likely have a condition called **occipital neuralgia**.      **What is Occipital Neuralgia?**      Occipital neuralgia is pain caused by irritation or inflammation of the nerves that run from the upper neck to the back of your head.[1][2] It typically causes sharp, stabbing pain in the back of the head that can radiate to the front. The nerve block you received is both a diagnostic test and a treatment for this condition.[1][3] Many patients find relief with nerve blocks, though you may need additional treatments in the future such as physical therapy, medications, or repeat nerve blocks if the pain returns.[1][4]      **Important MRI Findings Requiring Follow-Up**      Your MRI showed **no emergency problems**, which is good news. However, two findings need further evaluation with a follow-up MRI with contrast:      1. **Small mass near your left frontal and temporal lobes (1.0 x 0.8 cm)**: This appears to be a meningioma, which is a typically slow-growing tumor that arises from the lining of the brain. Most meningiomas are benign (not cancerous). Many small meningiomas like yours are found incidentally and can be safely watched with periodic imaging. The contrast MRI will help us  better characterize this  finding and determine if it needs treatment or just monitoring.[5][6]      2. **Heterogeneous (uneven) signal in the bone marrow of your clivus and upper neck**: The clivus is a bone at the base of your skull. Changes in the bone marrow signal can be normal variations related to aging, or they can indicate other conditions that need evaluation. The contrast MRI will help determine if this is a normal finding or requires further investigation.[7][8]      **What You Need to Do**      1. **Schedule your follow-up MRI brain with and without contrast** as soon as possible. This is important but not an emergency.      2. **Follow up with your primary care doctor** within 1-2 weeks to:         - Discuss your occipital neuralgia and ongoing headache management         - Review the MRI results when they are available         - Determine if you need referral to a neurologist or neurosurgeon      3. **Watch for warning signs** that require immediate medical attention:         - Sudden severe headache different from your usual pain         - Vision changes         - Weakness or numbness         - Difficulty speaking or walking         - Seizures  4. **For your headaches**: The nerve block may provide relief for days to weeks. If your headache returns, contact your doctor about additional treatment options, which may include physical therapy, medications, or repeat nerve blocks.[1][3][9]      **Questions?**      If you have any questions or concerns before your follow-up appointment, please contact your primary care physician or return to the emergency department if you develop any of the warning signs listed above.      ### References  1. Occipital Neuralgia. Caroll LELON Heinz JINNY, Elmofty D. Current Pain and Headache Reports. 2021;25(9):61. doi:10.1007/s11916-021-00972-1. 2. An Update on the Diagnosis, Treatment, and Management of Occipital Neuralgia. Justino JONETTA Sterling JONELLE, Boudreaux M, et al.  The Journal of Craniofacial Surgery. 2022;33(3):779-783. doi:10.1097/SCS.0000000000008360. 3. 11. Cervicogenic Headache and Occipital Neuralgia. Lefel N, van Suijlekom H, Cohen SPC, Kallewaard JW, Fleeta Laruth ALF Pain Practice : The Official Journal of [employer] of Pain. 2025;25(1):e13405. doi:10.1111/papr.13405. 4. Conservative Management of Occipital Neuralgia Supported by Physical Therapy: A Review of Available Research and Mechanistic Rationale to Guide Treatment. Tyron JONETTA, Sandgren A, Nelson EO, et al. Current Pain and Headache Reports. 2024;28(12):1321-1331. doi:10.1007/s11916-024-01288-6. 5. Central Nervous System Cancers. Occupational Hygienist. Updated 2024-09-07. 6. Glioblastoma and Other Primary Brain Malignancies in Adults: A Review. Schaff LR, Mellinghoff IK. JAMA. 2023;329(7):574-587. doi:10.1001/jama.2023.0023. 7. Benign and Malignant Diseases of the Clivus. Neelakantan A, Rana AK. Clinical Radiology. 2014;69(12):1295-303. doi:10.1016/j.crad.2014.07.010. 8. MR Imaging Characteristics of Cranial Bone Marrow in Adult Patients With Underlying Systemic Disorders Compared With Healthy Control Subjects. Loevner LA, Tobey JD, Yousem DM, Sonners AI, Hsu WC. AJNR. American Journal of Neuroradiology. 2002;23(2):248-54. 9. Occipital Neuralgia. Dougherty C. Current Pain and Headache Reports. 2014;18(5):411. doi:10.1007/s11916-919 009 6766-x.

## 2024-11-06 NOTE — ED Provider Notes (Signed)
 Hx of Ha New dizziness, no balance issues, dizziness still present + vomiting for 2 days , new aki Waiting for MRI brain- HA resolved.   Physical Exam  BP (!) 142/73   Pulse 91   Temp 97.9 F (36.6 C) (Oral)   Resp 18   Ht 5' 2 (1.575 m)   Wt 93 kg   SpO2 100%   BMI 37.49 kg/m   Physical Exam  Procedures  .Nerve Block  Date/Time: 11/06/2024 7:15 PM  Performed by: Arloa Chroman, PA-C Authorized by: Arloa Chroman, PA-C   Consent:    Consent obtained:  Verbal   Consent given by:  Patient   Risks discussed:  Allergic reaction, infection, bleeding, nerve damage, pain, swelling and unsuccessful block   Alternatives discussed:  No treatment Universal protocol:    Patient identity confirmed:  Verbally with patient Indications:    Indications:  Pain relief Location:    Body area:  Head   Head nerve:  Occipital   Laterality:  Left Pre-procedure details:    Skin preparation:  Alcohol Procedure details:    Block needle gauge:  27 G   Guidance comment:  Landmarks   Anesthetic injected:  3 mL bupivacaine  0.5 %   Steroid injected:  Methylprednisolone  (20 mg)   Injection procedure:  Anatomic landmarks identified, incremental injection, negative aspiration for blood and anatomic landmarks palpated   Paresthesia:  None Post-procedure details:    Dressing:  None   Outcome:  Anesthesia achieved   Procedure completion:  Tolerated well, no immediate complications   ED Course / MDM   Clinical Course as of 11/06/24 1914  Tue Nov 06, 2024  1405 Creatinine(!): 1.65 [CA]  1405 BUN(!): 26 [CA]  1644 [name]. The patient is visibly wincing in pain. ON exam she seems to have hyperesthesia of scalp to even light palpation. No evidence of lesions suggestive of zoster. [AH]    Clinical Course User Index [AH] Arloa Chroman, PA-C [CA] Crystal Aleck BROCKS, PA-C   Medical Decision Making Amount  and/or Complexity of Data Reviewed Labs: ordered. Decision-making details documented in ED Course. Radiology: ordered.  Risk Prescription drug management.    p patient here with bad headache.  On exam patient had some exquisite tenderness to her scalp and was wincing in pain.  Her headache was completely relieved by occipital nerve blocks.Crystal Foster likely has a diagnosis of occipital neuralgia.   MRI of the brain reveals incidental findings of a frontal mass that may be meningioma.  I discussed findings with Dr. Perley via phone call.  I also discussed all findings with the patient and her daughter at bedside.  Patient will need outpatient thoracic brain MRI for follow-up.  Do not think it is related to the symptoms she is having today.  She is otherwise appropriate for discharge at this time but headache and dizziness have resolved      Arloa Chroman, PA-C 11/06/24 [address] DO 11/06/24 2005
